# Patient Record
Sex: Female | Born: 1935 | Race: White | Hispanic: No | Marital: Married | Smoking: Former smoker
Health system: Southern US, Community
[De-identification: ages and names within clinical notes are randomized; demographics above are authoritative.]

## PROBLEM LIST (undated history)

## (undated) DIAGNOSIS — Z8489 Family history of other specified conditions: Secondary | ICD-10-CM

## (undated) DIAGNOSIS — I4891 Unspecified atrial fibrillation: Secondary | ICD-10-CM

## (undated) DIAGNOSIS — F0280 Dementia in other diseases classified elsewhere without behavioral disturbance: Secondary | ICD-10-CM

## (undated) DIAGNOSIS — I10 Essential (primary) hypertension: Secondary | ICD-10-CM

## (undated) DIAGNOSIS — F419 Anxiety disorder, unspecified: Secondary | ICD-10-CM

## (undated) DIAGNOSIS — G3183 Dementia with Lewy bodies: Secondary | ICD-10-CM

## (undated) DIAGNOSIS — E785 Hyperlipidemia, unspecified: Secondary | ICD-10-CM

## (undated) DIAGNOSIS — C801 Malignant (primary) neoplasm, unspecified: Secondary | ICD-10-CM

## (undated) DIAGNOSIS — F0390 Unspecified dementia without behavioral disturbance: Secondary | ICD-10-CM

## (undated) DIAGNOSIS — R002 Palpitations: Secondary | ICD-10-CM

## (undated) HISTORY — PX: APPENDECTOMY: SHX54

## (undated) HISTORY — DX: Hyperlipidemia, unspecified: E78.5

## (undated) HISTORY — PX: ABDOMINAL HYSTERECTOMY: SUR658

## (undated) HISTORY — DX: Malignant (primary) neoplasm, unspecified: C80.1

## (undated) HISTORY — DX: Palpitations: R00.2

## (undated) HISTORY — PX: BREAST BIOPSY: SHX20

## (undated) HISTORY — DX: Unspecified atrial fibrillation: I48.91

---

## 1997-09-17 ENCOUNTER — Ambulatory Visit (HOSPITAL_BASED_OUTPATIENT_CLINIC_OR_DEPARTMENT_OTHER): Admission: RE | Admit: 1997-09-17 | Discharge: 1997-09-17 | Payer: Self-pay | Admitting: General Surgery

## 1999-03-15 ENCOUNTER — Encounter: Payer: Self-pay | Admitting: Obstetrics and Gynecology

## 1999-03-15 ENCOUNTER — Encounter: Admission: RE | Admit: 1999-03-15 | Discharge: 1999-03-15 | Payer: Self-pay | Admitting: Obstetrics and Gynecology

## 1999-09-11 ENCOUNTER — Encounter: Payer: Self-pay | Admitting: Internal Medicine

## 1999-09-11 ENCOUNTER — Encounter: Admission: RE | Admit: 1999-09-11 | Discharge: 1999-09-11 | Payer: Self-pay | Admitting: Internal Medicine

## 2000-09-11 ENCOUNTER — Encounter: Payer: Self-pay | Admitting: General Surgery

## 2000-09-11 ENCOUNTER — Encounter: Admission: RE | Admit: 2000-09-11 | Discharge: 2000-09-11 | Payer: Self-pay | Admitting: General Surgery

## 2001-09-15 ENCOUNTER — Encounter: Payer: Self-pay | Admitting: General Surgery

## 2001-09-15 ENCOUNTER — Encounter: Admission: RE | Admit: 2001-09-15 | Discharge: 2001-09-15 | Payer: Self-pay | Admitting: General Surgery

## 2002-01-29 ENCOUNTER — Encounter: Payer: Self-pay | Admitting: Internal Medicine

## 2002-01-29 ENCOUNTER — Encounter: Admission: RE | Admit: 2002-01-29 | Discharge: 2002-01-29 | Payer: Self-pay | Admitting: Internal Medicine

## 2002-09-17 ENCOUNTER — Encounter: Admission: RE | Admit: 2002-09-17 | Discharge: 2002-09-17 | Payer: Self-pay | Admitting: General Surgery

## 2002-09-17 ENCOUNTER — Encounter: Payer: Self-pay | Admitting: General Surgery

## 2002-09-23 ENCOUNTER — Encounter: Admission: RE | Admit: 2002-09-23 | Discharge: 2002-09-23 | Payer: Self-pay | Admitting: General Surgery

## 2002-09-23 ENCOUNTER — Encounter: Payer: Self-pay | Admitting: General Surgery

## 2003-03-19 ENCOUNTER — Encounter: Admission: RE | Admit: 2003-03-19 | Discharge: 2003-03-19 | Payer: Self-pay | Admitting: General Surgery

## 2003-05-03 ENCOUNTER — Encounter (INDEPENDENT_AMBULATORY_CARE_PROVIDER_SITE_OTHER): Payer: Self-pay | Admitting: Specialist

## 2003-05-03 ENCOUNTER — Ambulatory Visit (HOSPITAL_COMMUNITY): Admission: RE | Admit: 2003-05-03 | Discharge: 2003-05-03 | Payer: Self-pay | Admitting: Gastroenterology

## 2003-05-14 ENCOUNTER — Other Ambulatory Visit: Admission: RE | Admit: 2003-05-14 | Discharge: 2003-05-14 | Payer: Self-pay | Admitting: Obstetrics and Gynecology

## 2003-09-23 ENCOUNTER — Encounter: Admission: RE | Admit: 2003-09-23 | Discharge: 2003-09-23 | Payer: Self-pay | Admitting: General Surgery

## 2004-09-28 ENCOUNTER — Encounter: Admission: RE | Admit: 2004-09-28 | Discharge: 2004-09-28 | Payer: Self-pay | Admitting: General Surgery

## 2005-10-04 ENCOUNTER — Encounter: Admission: RE | Admit: 2005-10-04 | Discharge: 2005-10-04 | Payer: Self-pay | Admitting: General Surgery

## 2005-10-12 ENCOUNTER — Encounter: Admission: RE | Admit: 2005-10-12 | Discharge: 2005-10-12 | Payer: Self-pay | Admitting: General Surgery

## 2006-10-10 ENCOUNTER — Encounter: Admission: RE | Admit: 2006-10-10 | Discharge: 2006-10-10 | Payer: Self-pay | Admitting: General Surgery

## 2007-03-07 ENCOUNTER — Emergency Department (HOSPITAL_COMMUNITY): Admission: EM | Admit: 2007-03-07 | Discharge: 2007-03-07 | Payer: Self-pay | Admitting: Emergency Medicine

## 2007-10-30 ENCOUNTER — Encounter: Admission: RE | Admit: 2007-10-30 | Discharge: 2007-10-30 | Payer: Self-pay | Admitting: Internal Medicine

## 2008-08-26 HISTORY — PX: CARDIAC CATHETERIZATION: SHX172

## 2008-09-08 ENCOUNTER — Inpatient Hospital Stay (HOSPITAL_COMMUNITY): Admission: AD | Admit: 2008-09-08 | Discharge: 2008-09-10 | Payer: Self-pay | Admitting: Cardiology

## 2008-11-05 ENCOUNTER — Encounter: Admission: RE | Admit: 2008-11-05 | Discharge: 2008-11-05 | Payer: Self-pay | Admitting: Internal Medicine

## 2009-10-04 ENCOUNTER — Ambulatory Visit: Payer: Self-pay | Admitting: Cardiovascular Disease

## 2009-11-01 ENCOUNTER — Ambulatory Visit: Payer: Self-pay | Admitting: Cardiovascular Disease

## 2009-11-15 ENCOUNTER — Encounter: Admission: RE | Admit: 2009-11-15 | Discharge: 2009-11-15 | Payer: Self-pay | Admitting: Surgery

## 2009-11-29 ENCOUNTER — Ambulatory Visit: Payer: Self-pay | Admitting: Cardiovascular Disease

## 2009-12-15 ENCOUNTER — Ambulatory Visit: Payer: Self-pay | Admitting: Cardiovascular Disease

## 2010-01-13 ENCOUNTER — Ambulatory Visit: Payer: Self-pay | Admitting: Cardiology

## 2010-02-10 ENCOUNTER — Ambulatory Visit: Payer: Self-pay | Admitting: Cardiology

## 2010-03-17 ENCOUNTER — Ambulatory Visit: Payer: Self-pay | Admitting: Cardiovascular Disease

## 2010-03-19 ENCOUNTER — Encounter: Payer: Self-pay | Admitting: Surgery

## 2010-03-28 ENCOUNTER — Ambulatory Visit: Payer: Self-pay | Admitting: Cardiovascular Disease

## 2010-04-11 ENCOUNTER — Encounter (INDEPENDENT_AMBULATORY_CARE_PROVIDER_SITE_OTHER): Payer: Medicare Other

## 2010-04-11 DIAGNOSIS — Z7901 Long term (current) use of anticoagulants: Secondary | ICD-10-CM

## 2010-04-11 DIAGNOSIS — I4891 Unspecified atrial fibrillation: Secondary | ICD-10-CM

## 2010-05-09 ENCOUNTER — Encounter (INDEPENDENT_AMBULATORY_CARE_PROVIDER_SITE_OTHER): Payer: Medicare Other

## 2010-05-09 DIAGNOSIS — I4891 Unspecified atrial fibrillation: Secondary | ICD-10-CM

## 2010-05-09 DIAGNOSIS — Z7901 Long term (current) use of anticoagulants: Secondary | ICD-10-CM

## 2010-06-04 LAB — PROTIME-INR
INR: 1.2 (ref 0.00–1.49)
Prothrombin Time: 15.5 seconds — ABNORMAL HIGH (ref 11.6–15.2)

## 2010-06-05 LAB — HEPARIN LEVEL (UNFRACTIONATED)
Heparin Unfractionated: 0.22 IU/mL — ABNORMAL LOW (ref 0.30–0.70)
Heparin Unfractionated: 0.41 IU/mL (ref 0.30–0.70)

## 2010-06-05 LAB — CBC
HCT: 34.9 % — ABNORMAL LOW (ref 36.0–46.0)
Hemoglobin: 11.9 g/dL — ABNORMAL LOW (ref 12.0–15.0)
RDW: 13 % (ref 11.5–15.5)

## 2010-06-05 LAB — COMPREHENSIVE METABOLIC PANEL
Albumin: 4 g/dL (ref 3.5–5.2)
CO2: 27 mEq/L (ref 19–32)
Calcium: 9.4 mg/dL (ref 8.4–10.5)
GFR calc Af Amer: >60 mL/min (ref >60)
GFR calc non Af Amer: >60 mL/min (ref >60)
Glucose, Bld: 101 mg/dL — ABNORMAL HIGH (ref 70–99)
Sodium: 137 mEq/L (ref 135–145)

## 2010-06-05 LAB — CARDIAC PANEL(CRET KIN+CKTOT+MB+TROPI)
Relative Index: 0.9 (ref 0.0–2.5)
Total CK: 102 U/L (ref 7–177)
Troponin I: 0.02 ng/mL (ref 0.00–0.06)
Troponin I: 0.03 ng/mL (ref 0.00–0.06)

## 2010-06-05 LAB — LIPID PANEL
LDL Cholesterol: 72 mg/dL (ref 0–99)
VLDL: 10 mg/dL (ref 0–40)

## 2010-06-05 LAB — DIGOXIN LEVEL: Digoxin Level: 0.8 ng/mL (ref 0.8–2.0)

## 2010-06-05 LAB — PROTIME-INR
INR: 1.4 (ref 0.00–1.49)
Prothrombin Time: 26.9 seconds — ABNORMAL HIGH (ref 11.6–15.2)

## 2010-06-06 ENCOUNTER — Ambulatory Visit (INDEPENDENT_AMBULATORY_CARE_PROVIDER_SITE_OTHER): Payer: Medicare Other | Admitting: *Deleted

## 2010-06-06 DIAGNOSIS — Z7901 Long term (current) use of anticoagulants: Secondary | ICD-10-CM

## 2010-06-06 DIAGNOSIS — I4891 Unspecified atrial fibrillation: Secondary | ICD-10-CM | POA: Insufficient documentation

## 2010-06-06 LAB — POCT INR: INR: 2.6

## 2010-06-20 ENCOUNTER — Encounter: Payer: Self-pay | Admitting: Cardiovascular Disease

## 2010-06-21 ENCOUNTER — Encounter: Payer: Self-pay | Admitting: Cardiovascular Disease

## 2010-06-21 DIAGNOSIS — R079 Chest pain, unspecified: Secondary | ICD-10-CM | POA: Insufficient documentation

## 2010-06-21 DIAGNOSIS — E785 Hyperlipidemia, unspecified: Secondary | ICD-10-CM | POA: Insufficient documentation

## 2010-06-27 ENCOUNTER — Ambulatory Visit: Payer: Medicare Other | Admitting: Cardiovascular Disease

## 2010-06-27 ENCOUNTER — Ambulatory Visit (INDEPENDENT_AMBULATORY_CARE_PROVIDER_SITE_OTHER): Payer: Medicare Other | Admitting: Cardiovascular Disease

## 2010-06-27 ENCOUNTER — Ambulatory Visit (INDEPENDENT_AMBULATORY_CARE_PROVIDER_SITE_OTHER): Payer: Medicare Other | Admitting: *Deleted

## 2010-06-27 ENCOUNTER — Encounter: Payer: Self-pay | Admitting: Cardiovascular Disease

## 2010-06-27 VITALS — BP 138/82 | HR 61 | Ht 66.0 in | Wt 134.6 lb

## 2010-06-27 DIAGNOSIS — I4891 Unspecified atrial fibrillation: Secondary | ICD-10-CM

## 2010-06-27 DIAGNOSIS — Z7901 Long term (current) use of anticoagulants: Secondary | ICD-10-CM

## 2010-06-27 NOTE — Assessment & Plan Note (Signed)
Starlena remains in normal sinus rhythm. We will continue with the same dose of breath. I've not heard of constipation being a complication of flecainide. I've asked her to check with her medical Dr. To make sure that she doesn't have other issues that are causing her constipation. She  had a colonoscopy a year ago which was normal. She's also had her thyroid checked recently and it was normal.

## 2010-06-27 NOTE — Progress Notes (Signed)
Candice Schultz Date of Birth  March 30, 1934 [facility] / [facility] [address]     [address] [place], [place]  [address] [phone]  Fax  [phone]  History of Present Illness:  Has had some dizziness and hypotension.  Has felt faint this week.  BP was 70.  Eating and drinking normally.  C/O constipation - Which she thinks is due to Flecainide. Rhythm has been stable.  She denies any chest pain or shortness of breath  Current Outpatient Prescriptions on File Prior to Visit  Medication Sig Dispense Refill  . aspirin 81 MG tablet Take 81 mg by mouth daily.        . calcium carbonate (OS-CAL) 600 MG TABS Take 600 mg by mouth 2 (two) times daily with a meal.        . digoxin (LANOXIN) 0.125 MG tablet Take 125 mcg by mouth daily.        . flecainide (TAMBOCOR) 100 MG tablet Take 100 mg by mouth 2 (two) times daily.        . MECLIZINE HCL PO Take by mouth as needed.        . Multiple Vitamin (MULTIVITAMIN) tablet Take 1 tablet by mouth daily.        . propranolol (INDERAL) 10 MG tablet Take 10 mg by mouth as needed.        . sodium chloride (OCEAN) 0.65 % nasal spray 1 spray by Nasal route as needed.        . warfarin (COUMADIN) 5 MG tablet Take by mouth as directed.        Marland Kitchen estradiol (ESTRING) 2 MG vaginal ring Place 2 mg vaginally every 3 (three) months. follow package directions         Allergies  Allergen Reactions  . Penicillins   . Rythmol Sr     Past Medical History  Diagnosis Date  . AF (atrial fibrillation)   . Chest pain   . Hyperlipidemia     Past Surgical History  Procedure Date  . Cardiac catheterization 08/2008    NORMAL CORONARY ARTERIES    History  Smoking status  . Former Smoker  . Quit date: 02/27/1983  Smokeless tobacco  . Not on file    History  Alcohol Use No    No family history on file.  Reviw of Systems:  Reviewed in the HPI.  All other systems are negative.  Physical Exam: BP 138/82  Pulse 61  Ht 5\' 6"   (1.676 m)  Wt 134 lb 9.6 oz (61.054 kg)  BMI 21.72 kg/m2 The patient is alert and oriented x 3.  The mood and affect are normal.  The skin is warm and dry.  Color is normal.  The HEENT exam reveals that the sclera are nonicteric.  The mucous membranes are moist.  The carotids are 2+ without bruits.  There is no thyromegaly.  There is no JVD.  The lungs are clear.  The chest wall is non tender.  The heart exam reveals a regular rate with a normal S1 and S2.  She has a 2/6 systolic murmur.  The PMI is not displaced.   Abdominal exam reveals good bowel sounds.  There is no guarding or rebound.  There is no hepatosplenomegaly or tenderness.  There are no masses.  Exam of the legs reveal no clubbing, cyanosis, or edema.  The legs are without rashes.  The distal pulses are intact.  Cranial nerves II - XII are  intact.  Motor and sensory functions are intact.  The gait is normal.  ECG: Normal sinus rhythm. She has a first degree AV block. She has T wave inversions in leads V3 through V5.  Assessment / Plan:

## 2010-07-11 NOTE — Cardiovascular Report (Signed)
[name], Candice Schultz                  ACCOUNT NO.:  [id number]   MEDICAL RECORD NO.:  [id number]          PATIENT TYPE:  INP   LOCATION:  3707                         FACILITY:  [facility]   PHYSICIAN:  Colleen Can. Deborah Chalk, M.D.DATE OF BIRTH:  [birth year]   DATE OF PROCEDURE:  09/09/2008  DATE OF DISCHARGE:                            CARDIAC CATHETERIZATION   PROCEDURE:  Left heart catheterization with selective coronary  angiography and left ventricular angiography.   TYPE AND SITE OF ENTRY:  Percutaneous right femoral artery.   CATHETERS:  6-French 4 curved Judkins right and left coronary catheters  and 6-French pigtail ventriculographic catheter.   CONTRAST MATERIAL:  Omnipaque.   MEDICATIONS GIVEN PRIOR TO PROCEDURE:  Valium 10 mg.   MEDICATIONS GIVEN DURING THE PROCEDURE:  Versed 2 mg IV.   COMMENTS:  The patient tolerated the procedure well.   HEMODYNAMIC DATA:  The aortic pressure was 128/4-16.  The LV was 142/2-  15.  There was no aortic valve gradient noted on pullback.   ANGIOGRAPHIC DATA:  1. Left main coronary artery is normal.  2. Left anterior descending was only of moderate size.  It is      tortuous.  It was normal.  3. Left circumflex continued as a moderate-sized obtuse marginal      vessel with no frank continuation in the AV groove that was noted.      Anomalous possibilities were entertained, but could not be seen in      the right coronary cusp or off of the left cusp.  It is felt that      the circumflex marginal was the left circumflex system in toto.   Right coronary artery:  The right coronary artery is a very large  dominant vessel with a continuation branch and a large posterior  descending that extended to the apex.  It was normal.   Left ventricular angiogram was performed in the RAO position.  Overall  cardiac size and silhouette were normal.  The global ejection fraction  was estimated to be at 60%.  Regional wall motion was normal.   Right femoral  artery angiogram was performed with anticipation of the  Angio-Seal closure.  However, puncture was made into recurrent branch to  the inferior epigastric wall.  It is felt that it was not suitable for  Angio-Seal.   OVERALL IMPRESSION:  1. Normal left ventricular function.  2. Normal coronary arteries.      Colleen Can. Deborah Chalk, M.D.  Electronically Signed     SNT/MEDQ  D:  09/09/2008  T:  09/10/2008  Job:  [id number]

## 2010-07-11 NOTE — H&P (Signed)
Candice Schultz, Candice Schultz                  ACCOUNT NO.:  [id number]   MEDICAL RECORD NO.:  [id number]          PATIENT TYPE:  INP   LOCATION:  3707                         FACILITY:  [facility]   PHYSICIAN:  Cassell Clement, M.D. DATE OF BIRTH:  [birth year]   DATE OF ADMISSION:  09/08/2008  DATE OF DISCHARGE:                              HISTORY & PHYSICAL   CHIEF COMPLAINT:  Chest pain.   HISTORY:  This is a 76 year old married Caucasian female who is seen as  a work-in in the office on the morning of September 08, 2008.  She presents  with a history of substernal chest pressure radiating up to both  shoulders, but not down to the arms.  The pain began when she was at  work earlier this morning and has persisted about an hour and a half.  There was no nausea or vomiting, but she was somewhat short of breath.  She did not have any diaphoresis.  She does not have any prior history  of ischemic heart disease.  She had a treadmill Cardiolite stress test  on June 19, 2004, which was negative.  She walked 8 minutes which was 2  minutes into stage III and the test was stopped because of achieving  target heart rate.  The patient does have a history of atrial  fibrillation, which has been paroxysmal.  She also has a history of  mitral valve prolapse.  She was last seen in our office on August 26, 2008,  by Dr. Elease Hashimoto.  She had had a prior trial of Rythmol but did not  tolerate it.  She has been looking into the possibility of a  radiofrequency ablation.  She has a daughter who lives in [place],  [place], and is pursuing possible ablation centers in [place].  The patient has been on Coumadin since Jun 29, 2008.  Her INR on August 26, 2008, was subtherapeutic at 1.6 and her Coumadin dose at that time was  increased to 5 mg 4 days a week and 2.5 mg 3 days a week.  She returned  today as an urgent work-in because of the chest pain and we did an INR,  which she is now therapeutic at 2.4.   FAMILY HISTORY:   Negative for premature coronary artery disease.  Family  history is positive for atrial fibrillation in one sister.  Both of her  parents lived into their mid 11s and died of noncoronary cause.   SOCIAL HISTORY:  The patient smoked briefly after nursing school, but  has not smoked in many years.  She does not drink alcohol.  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   PRESENT MEDICATIONS:  1. Calcium 600 mg daily.  2. Aspirin 325 mg daily.  3. Multivitamin daily.  4. Simvastatin 20 mg daily.  5. Meclizine p.r.n.  6. Coumadin 5 mg 4 days a weak, 2.5 mg 3 days a week.  7. Toprol-XL 50 mg daily.  8. Digoxin 0.25 mg daily.  9. Estring every 3 months.   ALLERGIES:  She is allergic to PENICILLIN and she did not tolerate  RYTHMOL.   All other review of systems negative in detail.   PHYSICAL EXAMINATION:  GENERAL:  In the office, her chest pain had  stopped and then started again precipitating our decision to arrange for  hospitalization today.  Her general appearance reveals a well-developed  and well-nourished woman in no distress.  SKIN:  She has skin which is warm and dry.  She is not diaphoretic.  Color is good.  Skin reveals no rash.  HEENT:  Pupils equal and reactive.  Sclerae nonicteric.  Mouth and  pharynx normal.  NECK:  Jugular venous pressure normal.  Thyroid normal.  Carotids  normal.  No lymphadenopathy.  CHEST:  Clear to percussion and auscultation.  HEART:  A faint apical systolic murmur.  There is no gallop or rub.  There is no chest wall tenderness.  ABDOMEN:  Soft without hepatosplenomegaly or masses.  The liver and  spleen are not enlarged.  EXTREMITIES:  Good peripheral pulses.  No phlebitis or edema.  NEUROLOGIC:  Physiologic.   Her electrocardiogram in the office showed new lateral ST-segment   depression in lead II, III, and V4 through V6.  She is in atrial  fibrillation with a controlled ventricular response.  The ST-segment  depression may be due to digitalis effect, but we certainly can exclude  new lateral wall ischemia since her previous EKG.  Her INR is 2.4.   IMPRESSION:  1. Chest pain, possible unstable angina pectoris.  2. Atrial fibrillation.  3. Coumadin anticoagulation.  4. History of hyperlipidemia.  5. Past history of cigarette smoking remotely.   DISPOSITION:  She is being admitted to 3700 telemetry.  Serial enzymes  will be obtained.  Serial EKGs.  We will reverse her Coumadin with IV  vitamin K today and put her on IV heparin.  We will continue beta  blocker and aspirin.  We will get a digoxin level and check lipids.  We  will anticipate cardiac catheterization tomorrow on September 09, 2008, after  correction of her coagulation abnormalities by Dr. Deborah Chalk.           ______________________________  Cassell Clement, M.D.     TB/MEDQ  D:  09/08/2008  T:  09/09/2008  Job:  [id number]   cc:   Thora Lance, M.D.  Vesta Mixer, M.D.  Colleen Can. Deborah Chalk, M.D.

## 2010-07-11 NOTE — Discharge Summary (Signed)
NAMEEAVAN, GONTERMAN                  ACCOUNT NO.:  [id number]   MEDICAL RECORD NO.:  [id number]          PATIENT TYPE:  INP   LOCATION:  3707                         FACILITY:  [facility]   PHYSICIAN:  Colleen Can. Deborah Chalk, M.D.DATE OF BIRTH:  14-Jan-1935   DATE OF ADMISSION:  09/08/2008  DATE OF DISCHARGE:  09/10/2008                               DISCHARGE SUMMARY   DISCHARGE DIAGNOSES:  1. Chest pain with negative cardiac enzymes, and subsequent elective      cardiac catheterization showing normal coronary arteries with      normal left ventricular function.  2. Recurrent atrial fibrillation.  The patient is pursuing possible      ablative therapy.  3. Coumadin anticoagulation.  4. Hyperlipidemia.  5. Remote tobacco abuse.   HISTORY OF PRESENT ILLNESS:  Candice Schultz is a very pleasant 76 year old white  female who was seen as a work-in by Dr. Patty Sermons in the morning of September 08, 2008.  She was complaining of substernal chest pressure that  radiated up to both shoulders, but not down the arms.  Her discomfort  started while she was at work and persisted for at least an hour and a  half.  She was short of breath.  She did not have any nausea, vomiting,  or diaphoresis.  She was subsequently seen in the office and was  referred for admission with plans for cardiac catheterization.   Please see the history and physical for further patient presentation and  profile.   LABORATORY DATA:  On admission, her cardiac enzymes were negative.  Her  CBC showed hemoglobin 11.9, hematocrit 34.9, white count 4.3, and  platelets 222.  Chemistry showed sodium 137, potassium 4.0, chloride  103, CO2 of 27, BUN 9, creatinine 0.7, and a glucose of 101.  Her INR  initially was 2.4.  She was given vitamin K and subsequently dropped to  1.4.  All cardiac enzymes were negative.  Her total cholesterol was 140,  LDL 72, HDL 58, triglycerides 52.  Digoxin level is 0.8 and high  sensitivity CRP is 0.3.   Chest x-ray  showed minimal enlargement the cardiac silhouette.  There  were no acute findings.  Her EKG showed new lateral ST depression in  leads V4 through V6 in the setting of atrial fibrillation.   HOSPITAL COURSE:  The patient was admitted electively.  Serial enzymes  were drawn.  Her Coumadin was reversed with vitamin K.  Her home  medicines were continued.  We proceeded on with cardiac catheterization  the following afternoon.  That procedure was tolerated well without any  known complications.  LV function was normal.  The left main was normal.  The LAD is normal.  Left circumflex is small, but normal.  Right  coronary is a large dominant vessel, but is normal.  The right femoral  artery puncture site was not suitable for Angio-Seal, and the patient  subsequently was sent back to telemetry and had sufficient bedrest time.  She did have transient hypotension that was treated with IV fluids with  resolution.   Today on September 10, 2008, she is doing well without complaints.  Blood  pressure is satisfactory. She remains in atrial fibrillation. She is  felt to be a satisfactory candidate for discharge.  She is continuing to  pursue a possibility of ablation.  She has not tolerated Rythmol in the  past.   DISCHARGE CONDITION:  Stable.   DISCHARGE DIET:  Low salt, heart healthy.   ACTIVITY:  To be increased as tolerated.   WOUND CARE:  She can use an ice pack if needed to the right groin.   We will have her follow up with Dr. Elease Hashimoto in 1 week.  She will need to  have a followup protime at that visit as well.   DISCHARGE MEDICINES:  1. Coumadin 5 mg x4 days, 2.5 mg x3 days.  2. Digoxin 0.25 daily.  3. Simvastatin 20 mg a day daily.  4. Toprol-XL 50 mg daily.  5. Baby aspirin daily.  6. Multivitamin daily.  7. Caltrate daily.   We will plan on seeing her back in the office in 1 week, certainly  sooner if any problems would arise in the interim.   Greater than 30 minutes was spent  preparing this discharge.      Sharlee Blew, N.P.      Colleen Can. Deborah Chalk, M.D.  Electronically Signed    LC/MEDQ  D:  09/10/2008  T:  09/10/2008  Job:  [id number]   cc:   Vesta Mixer, M.D.

## 2010-07-14 NOTE — Op Note (Signed)
NAMELORRY, FURBER                            ACCOUNT NO.:  [id number]   MEDICAL RECORD NO.:  [id number]                   PATIENT TYPE:  AMB   LOCATION:  ENDO                                 FACILITY:  [facility]   PHYSICIAN:  Danise Edge, M.D.                DATE OF BIRTH:  04-Apr-1934   DATE OF PROCEDURE:  05/03/2003  DATE OF DISCHARGE:                                 OPERATIVE REPORT   PROCEDURE:  Screening colonoscopy with polypectomy.   INDICATIONS:  Candice Schultz is a 76 year old female, born November 01, 1934.  Candice Schultz is scheduled to undergo her first screening colonoscopy  with polypectomy to prevent colon cancer.   ENDOSCOPIST:  Danise Edge, M.D.   PREMEDICATION:  Versed 6 mg, fentanyl  62.5 mcg.   DESCRIPTION OF PROCEDURE:  After obtaining informed consent, Candice Schultz was  placed in the left lateral decubitus position.  I administered intravenous  fentanyl 62.5 mcg and intravenous Versed 6 mg  to achieve conscious sedation  for the procedure.  The patient's blood pressure, oxygen saturation and  cardiac rhythm were monitored throughout the procedure and documented in the  medical record.   Anal inspection was normal.  Digital rectal exam was normal.  The Olympus  adjustable pediatric colonoscope was introduced into the rectum and advanced  to the cecum.  Colonic preparation for the exam today was excellent.  Rectum:  Normal.  Sigmoid colon and descending colon:  From the distal sigmoid colon and 30 cm  from the anal verge, a 2 mm sessile polyp was removed with electrocautery  snare and submitted for pathology interpretation.  Splenic flexure:  Normal.  Transverse colon:  Normal.  Hepatic flexure:  Normal.  Ascending colon:  Normal.  Cecum and ileocecal valve:  Normal.   ASSESSMENT:  From the distal sigmoid colon, a 2 mm sessile polyp was removed  with electrocautery snare; otherwise normal screening proctocolonoscopy to  the cecum.   RECOMMENDATIONS:   Repeat colonoscopy in five years if polyp returns  neoplastic pathologically.                                               Danise Edge, M.D.    MJ/MEDQ  D:  05/03/2003  T:  05/03/2003  Job:  [id number]   cc:   Thora Lance, M.D.  [address]  [place]  [place] [address]  Fax: [phone]

## 2010-07-28 ENCOUNTER — Encounter: Payer: Medicare Other | Admitting: *Deleted

## 2010-07-31 ENCOUNTER — Encounter (INDEPENDENT_AMBULATORY_CARE_PROVIDER_SITE_OTHER): Payer: Medicare Other | Admitting: *Deleted

## 2010-07-31 DIAGNOSIS — Z7901 Long term (current) use of anticoagulants: Secondary | ICD-10-CM

## 2010-07-31 DIAGNOSIS — I4891 Unspecified atrial fibrillation: Secondary | ICD-10-CM

## 2010-08-25 ENCOUNTER — Ambulatory Visit (INDEPENDENT_AMBULATORY_CARE_PROVIDER_SITE_OTHER): Payer: Medicare Other | Admitting: *Deleted

## 2010-08-25 DIAGNOSIS — I4891 Unspecified atrial fibrillation: Secondary | ICD-10-CM

## 2010-08-25 DIAGNOSIS — Z7901 Long term (current) use of anticoagulants: Secondary | ICD-10-CM

## 2010-08-28 ENCOUNTER — Encounter: Payer: Medicare Other | Admitting: *Deleted

## 2010-09-22 ENCOUNTER — Ambulatory Visit (INDEPENDENT_AMBULATORY_CARE_PROVIDER_SITE_OTHER): Payer: Medicare Other | Admitting: *Deleted

## 2010-09-22 DIAGNOSIS — Z7901 Long term (current) use of anticoagulants: Secondary | ICD-10-CM

## 2010-09-22 DIAGNOSIS — I4891 Unspecified atrial fibrillation: Secondary | ICD-10-CM

## 2010-09-22 LAB — POCT INR: INR: 2.5

## 2010-10-10 ENCOUNTER — Other Ambulatory Visit (INDEPENDENT_AMBULATORY_CARE_PROVIDER_SITE_OTHER): Payer: Self-pay | Admitting: Surgery

## 2010-10-10 DIAGNOSIS — Z1231 Encounter for screening mammogram for malignant neoplasm of breast: Secondary | ICD-10-CM

## 2010-10-20 ENCOUNTER — Ambulatory Visit (INDEPENDENT_AMBULATORY_CARE_PROVIDER_SITE_OTHER): Payer: Medicare Other | Admitting: *Deleted

## 2010-10-20 DIAGNOSIS — I4891 Unspecified atrial fibrillation: Secondary | ICD-10-CM

## 2010-10-20 DIAGNOSIS — Z7901 Long term (current) use of anticoagulants: Secondary | ICD-10-CM

## 2010-10-20 LAB — POCT INR: INR: 2.7

## 2010-10-30 ENCOUNTER — Other Ambulatory Visit: Payer: Self-pay | Admitting: Cardiovascular Disease

## 2010-10-31 ENCOUNTER — Other Ambulatory Visit: Payer: Self-pay | Admitting: Pharmacist

## 2010-10-31 ENCOUNTER — Other Ambulatory Visit: Payer: Self-pay | Admitting: *Deleted

## 2010-10-31 MED ORDER — PROPRANOLOL HCL 10 MG PO TABS: ORAL_TABLET | ORAL | Status: DC

## 2010-10-31 MED ORDER — WARFARIN SODIUM 5 MG PO TABS: ORAL_TABLET | ORAL | Status: DC

## 2010-10-31 MED ORDER — DIGOXIN 250 MCG PO TABS: 250.0000 ug | ORAL_TABLET | Freq: Every day | ORAL | Status: DC

## 2010-10-31 NOTE — Telephone Encounter (Signed)
Dr Ian Bushman confirmed refill.

## 2010-10-31 NOTE — Telephone Encounter (Signed)
Called pharm and verified dose, rx same for one year, entered incorrectly in our e-record.

## 2010-11-01 ENCOUNTER — Other Ambulatory Visit: Payer: Self-pay | Admitting: *Deleted

## 2010-11-01 MED ORDER — DIGOXIN 250 MCG PO TABS: 250.0000 ug | ORAL_TABLET | Freq: Every day | ORAL | Status: DC

## 2010-11-17 ENCOUNTER — Ambulatory Visit (INDEPENDENT_AMBULATORY_CARE_PROVIDER_SITE_OTHER): Payer: Medicare Other | Admitting: *Deleted

## 2010-11-17 DIAGNOSIS — Z7901 Long term (current) use of anticoagulants: Secondary | ICD-10-CM

## 2010-11-17 DIAGNOSIS — I4891 Unspecified atrial fibrillation: Secondary | ICD-10-CM

## 2010-11-29 ENCOUNTER — Ambulatory Visit
Admission: RE | Admit: 2010-11-29 | Discharge: 2010-11-29 | Disposition: A | Discharge status: Home / Self Care | Payer: Medicare Other | Source: Ambulatory Visit | Attending: Surgery | Admitting: Surgery

## 2010-11-29 DIAGNOSIS — Z1231 Encounter for screening mammogram for malignant neoplasm of breast: Secondary | ICD-10-CM

## 2010-12-11 ENCOUNTER — Encounter (INDEPENDENT_AMBULATORY_CARE_PROVIDER_SITE_OTHER): Payer: Self-pay | Admitting: General Surgery

## 2010-12-11 DIAGNOSIS — Z853 Personal history of malignant neoplasm of breast: Secondary | ICD-10-CM | POA: Insufficient documentation

## 2010-12-12 ENCOUNTER — Encounter (INDEPENDENT_AMBULATORY_CARE_PROVIDER_SITE_OTHER): Payer: Self-pay | Admitting: Surgery

## 2010-12-12 ENCOUNTER — Ambulatory Visit (INDEPENDENT_AMBULATORY_CARE_PROVIDER_SITE_OTHER): Payer: Medicare Other | Admitting: Surgery

## 2010-12-12 VITALS — BP 126/88 | HR 88 | Temp 97.4°F | Resp 20 | Ht 65.75 in | Wt 132.4 lb

## 2010-12-12 DIAGNOSIS — Z853 Personal history of malignant neoplasm of breast: Secondary | ICD-10-CM

## 2010-12-12 NOTE — Progress Notes (Signed)
NAME: Candice Schultz       DOB: 1935/01/23           DATE: 12/12/2010       MRN: [id number]   Candice Schultz is a 76 y.o.Marland Kitchenfemale who presents for routine followup of her LCIS Right breast diagnosed in 1999 and treated with lumpectomy. She has no problems or concerns on either side.  PFSH: She has had no significant changes since the last visit here.  ROS: There have been no significant changes since the last visit here  EXAM: General: The patient is alert, oriented, generally healty appearing, NAD. Mood and affect are normal.  Breasts:  Symmetric in appearance. No mass, tenderness or problems on either side  Lymphatics: She has no axillary or supraclavicular adenopathy on either side.  Extremities: Full ROM of the surgical side with no lymphedema noted.  Data Reviewed: Mammogram earlier this month was wnl  Impression: Doing well, with no evidence of recurrent cancer or new cancer  Plan: Will continue to follow up on an annual basis here.

## 2010-12-12 NOTE — Patient Instructions (Signed)
I will see you again next year. Let me know if you have any problems

## 2010-12-15 ENCOUNTER — Ambulatory Visit (INDEPENDENT_AMBULATORY_CARE_PROVIDER_SITE_OTHER): Payer: Medicare Other | Admitting: *Deleted

## 2010-12-15 DIAGNOSIS — Z7901 Long term (current) use of anticoagulants: Secondary | ICD-10-CM

## 2010-12-15 DIAGNOSIS — I4891 Unspecified atrial fibrillation: Secondary | ICD-10-CM

## 2011-01-03 ENCOUNTER — Other Ambulatory Visit: Payer: Self-pay | Admitting: *Deleted

## 2011-01-03 MED ORDER — PROPRANOLOL HCL 10 MG PO TABS: ORAL_TABLET | ORAL | Status: DC

## 2011-01-12 ENCOUNTER — Ambulatory Visit (INDEPENDENT_AMBULATORY_CARE_PROVIDER_SITE_OTHER): Payer: Medicare Other | Admitting: *Deleted

## 2011-01-12 DIAGNOSIS — Z7901 Long term (current) use of anticoagulants: Secondary | ICD-10-CM

## 2011-01-12 DIAGNOSIS — I4891 Unspecified atrial fibrillation: Secondary | ICD-10-CM

## 2011-01-24 ENCOUNTER — Other Ambulatory Visit: Payer: Self-pay | Admitting: Pharmacist

## 2011-01-24 MED ORDER — WARFARIN SODIUM 5 MG PO TABS: ORAL_TABLET | ORAL | Status: DC

## 2011-01-26 ENCOUNTER — Other Ambulatory Visit: Payer: Self-pay

## 2011-01-26 MED ORDER — PROPRANOLOL HCL 10 MG PO TABS: ORAL_TABLET | ORAL | Status: DC

## 2011-01-26 MED ORDER — DIGOXIN 250 MCG PO TABS: 125.0000 ug | ORAL_TABLET | Freq: Every day | ORAL | Status: DC

## 2011-01-26 MED ORDER — FLECAINIDE ACETATE 100 MG PO TABS: 100.0000 mg | ORAL_TABLET | Freq: Two times a day (BID) | ORAL | Status: DC

## 2011-02-05 ENCOUNTER — Other Ambulatory Visit: Payer: Self-pay | Admitting: Cardiovascular Disease

## 2011-02-05 MED ORDER — WARFARIN SODIUM 5 MG PO TABS: ORAL_TABLET | ORAL | Status: DC

## 2011-02-09 ENCOUNTER — Ambulatory Visit (INDEPENDENT_AMBULATORY_CARE_PROVIDER_SITE_OTHER): Payer: Medicare Other | Admitting: *Deleted

## 2011-02-09 DIAGNOSIS — I4891 Unspecified atrial fibrillation: Secondary | ICD-10-CM

## 2011-02-09 DIAGNOSIS — Z7901 Long term (current) use of anticoagulants: Secondary | ICD-10-CM

## 2011-02-09 LAB — POCT INR: INR: 2.3

## 2011-03-09 ENCOUNTER — Ambulatory Visit (INDEPENDENT_AMBULATORY_CARE_PROVIDER_SITE_OTHER): Payer: Medicare Other | Admitting: *Deleted

## 2011-03-09 DIAGNOSIS — Z7901 Long term (current) use of anticoagulants: Secondary | ICD-10-CM

## 2011-03-09 DIAGNOSIS — I4891 Unspecified atrial fibrillation: Secondary | ICD-10-CM

## 2011-04-20 ENCOUNTER — Ambulatory Visit (INDEPENDENT_AMBULATORY_CARE_PROVIDER_SITE_OTHER): Payer: Medicare Other

## 2011-04-20 ENCOUNTER — Encounter: Payer: Medicare Other | Admitting: *Deleted

## 2011-04-20 DIAGNOSIS — Z7901 Long term (current) use of anticoagulants: Secondary | ICD-10-CM

## 2011-04-20 DIAGNOSIS — I4891 Unspecified atrial fibrillation: Secondary | ICD-10-CM

## 2011-04-20 LAB — POCT INR: INR: 2.4

## 2011-04-20 MED ORDER — WARFARIN SODIUM 5 MG PO TABS: ORAL_TABLET | ORAL | Status: DC

## 2011-06-01 ENCOUNTER — Ambulatory Visit (INDEPENDENT_AMBULATORY_CARE_PROVIDER_SITE_OTHER): Payer: Medicare Other

## 2011-06-01 DIAGNOSIS — I4891 Unspecified atrial fibrillation: Secondary | ICD-10-CM

## 2011-06-01 DIAGNOSIS — Z7901 Long term (current) use of anticoagulants: Secondary | ICD-10-CM

## 2011-06-27 ENCOUNTER — Encounter: Payer: Self-pay | Admitting: Cardiovascular Disease

## 2011-06-27 ENCOUNTER — Ambulatory Visit (INDEPENDENT_AMBULATORY_CARE_PROVIDER_SITE_OTHER): Payer: Medicare Other | Admitting: Cardiovascular Disease

## 2011-06-27 ENCOUNTER — Ambulatory Visit (INDEPENDENT_AMBULATORY_CARE_PROVIDER_SITE_OTHER): Payer: Medicare Other | Admitting: *Deleted

## 2011-06-27 VITALS — BP 130/80 | HR 75 | Ht 66.0 in | Wt 132.0 lb

## 2011-06-27 DIAGNOSIS — I4891 Unspecified atrial fibrillation: Secondary | ICD-10-CM

## 2011-06-27 DIAGNOSIS — Z7901 Long term (current) use of anticoagulants: Secondary | ICD-10-CM

## 2011-06-27 DIAGNOSIS — E785 Hyperlipidemia, unspecified: Secondary | ICD-10-CM

## 2011-06-27 NOTE — Assessment & Plan Note (Signed)
Candice Schultz remains in normal sinus rhythm. We will continue with her current dose of. We'll see her again in one year for an office visit, EKG, and fasting labs.

## 2011-06-27 NOTE — Assessment & Plan Note (Signed)
We'll check fasting labs will see her again in one year. She'll have them checked at her medical doctors office.

## 2011-06-27 NOTE — Patient Instructions (Signed)
Your physician wants you to follow-up in: 1 YEAR You will receive a reminder letter in the mail two months in advance. If you don't receive a letter, please call our office to schedule the follow-up appointment.  Your physician recommends that you return for a FASTING lipid profile: 1 YEAR OR BRING [place] A COPY FOR YOUR CHART HERE, THANKS

## 2011-06-27 NOTE — Progress Notes (Signed)
Candice Schultz Date of Birth  06/13/1934 [facility] [address]     [address] [place], [place]  [address] [phone]  Fax  [phone]  Problem List: 1. Atrial Fibrillation 2. Chest pain - normal coronary arteries 2010 3.  hyperlipidemia 4. Hx of Breast cancer - 1999  History of Present Illness:  Candice Schultz has done well.  Her rhythm has remained stable. She continues to have constipation due to the Flecainide.  She takes propranolol occasionally .  She is exercising on occasion.  Current Outpatient Prescriptions on File Prior to Visit  Medication Sig Dispense Refill  . aspirin 81 MG tablet Take 81 mg by mouth daily.        . calcium carbonate (OS-CAL) 600 MG TABS Take 600 mg by mouth 2 (two) times daily with a meal.        . digoxin (LANOXIN) 0.25 MG tablet Take 0.5 tablets (125 mcg total) by mouth daily.  90 tablet  1  . docusate sodium (COLACE) 100 MG capsule Take 100 mg by mouth 2 (two) times daily.        . flecainide (TAMBOCOR) 100 MG tablet Take 1 tablet (100 mg total) by mouth 2 (two) times daily.  180 tablet  2  . MECLIZINE HCL PO Take by mouth as needed.        . Multiple Vitamin (MULTIVITAMIN) tablet Take 1 tablet by mouth daily.        . propranolol (INDERAL) 10 MG tablet Take one tablet up to 4 times a day as needed  360 tablet  0  . sodium chloride (OCEAN) 0.65 % nasal spray 1 spray by Nasal route as needed.        . warfarin (COUMADIN) 5 MG tablet Take as directed by Anticoagulation clinic.  (Pt takes up to 1 1/2 tablets daily)  135 tablet  1    Allergies  Allergen Reactions  . Penicillins   . Propafenone Hcl Er     Past Medical History  Diagnosis Date  . AF (atrial fibrillation)   . Chest pain   . Hyperlipidemia   . Cancer     breast  . Heart palpitations     related for flecainide and due to having afib   . Constipation     Past Surgical History  Procedure Date  . Cardiac catheterization 08/2008    NORMAL  CORONARY ARTERIES    History  Smoking status  . Former Smoker  . Quit date: 02/27/1983  Smokeless tobacco  . Never Used    History  Alcohol Use No    Family History  Problem Relation Age of Onset  . Cancer Mother     Reviw of Systems:  Reviewed in the HPI.  All other systems are negative.  Physical Exam: BP 174/83  Pulse 75  Ht 5\' 6"  (1.676 m)  Wt 132 lb (59.875 kg)  BMI 21.31 kg/m2 The patient is alert and oriented x 3.  The mood and affect are normal.  The skin is warm and dry.  Color is normal.  The HEENT exam reveals that the sclera are nonicteric.  The mucous membranes are moist.  The carotids are 2+ without bruits.  There is no thyromegaly.  There is no JVD.  The lungs are clear.  The chest wall is non tender.  The heart exam reveals a regular rate with a normal S1 and S2.  She has a 1-2/6 systolic murmur.  The  PMI is not displaced.   Abdominal exam reveals good bowel sounds.  There is no guarding or rebound.  There is no hepatosplenomegaly or tenderness.  There are no masses.  Exam of the legs reveal no clubbing, cyanosis, or edema.  The legs are without rashes.  The distal pulses are intact.  Cranial nerves II - XII are intact.  Motor and sensory functions are intact.  The gait is normal.  ECG: Jun 27, 2011-Normal sinus rhythm. She has a first degree AV block. She has T wave inversions in leads V3 through V5. The EKG is unchanged from previous tracing.  Assessment / Plan:

## 2011-08-08 ENCOUNTER — Ambulatory Visit (INDEPENDENT_AMBULATORY_CARE_PROVIDER_SITE_OTHER): Payer: Medicare Other | Admitting: Pharmacist

## 2011-08-08 DIAGNOSIS — I4891 Unspecified atrial fibrillation: Secondary | ICD-10-CM

## 2011-08-08 DIAGNOSIS — Z7901 Long term (current) use of anticoagulants: Secondary | ICD-10-CM

## 2011-08-08 LAB — POCT INR: INR: 2.2

## 2011-09-19 ENCOUNTER — Ambulatory Visit (INDEPENDENT_AMBULATORY_CARE_PROVIDER_SITE_OTHER): Payer: Medicare Other | Admitting: *Deleted

## 2011-09-19 DIAGNOSIS — Z7901 Long term (current) use of anticoagulants: Secondary | ICD-10-CM

## 2011-09-19 DIAGNOSIS — I4891 Unspecified atrial fibrillation: Secondary | ICD-10-CM

## 2011-09-19 LAB — POCT INR: INR: 2.7

## 2011-09-27 ENCOUNTER — Telehealth: Payer: Self-pay | Admitting: Cardiovascular Disease

## 2011-09-27 NOTE — Telephone Encounter (Signed)
Spoke with pt, pt took her 5mg  pm dosage of Coumadin this am by accident.  Advised pt to not take another dosage of Coumadin tonight, but then to resume normal dosage of Coumadin at scheduled pm time on Friday.  Pt verbalizes understanding.

## 2011-09-27 NOTE — Telephone Encounter (Signed)
Pt took pm meds this am , is there anything she needs to do, and does she need to take them again tonight?? pls call

## 2011-10-17 ENCOUNTER — Ambulatory Visit (INDEPENDENT_AMBULATORY_CARE_PROVIDER_SITE_OTHER): Payer: Medicare Other | Admitting: Pharmacist

## 2011-10-17 DIAGNOSIS — Z7901 Long term (current) use of anticoagulants: Secondary | ICD-10-CM

## 2011-10-17 DIAGNOSIS — I4891 Unspecified atrial fibrillation: Secondary | ICD-10-CM

## 2011-10-17 LAB — POCT INR: INR: 2.1

## 2011-10-19 ENCOUNTER — Other Ambulatory Visit: Payer: Self-pay | Admitting: Cardiovascular Disease

## 2011-10-19 NOTE — Telephone Encounter (Signed)
Fax Received. Refill Completed. Rosiland Sen Chowoe (R.M.A)   

## 2011-11-08 ENCOUNTER — Other Ambulatory Visit (INDEPENDENT_AMBULATORY_CARE_PROVIDER_SITE_OTHER): Payer: Self-pay | Admitting: Surgery

## 2011-11-08 DIAGNOSIS — Z853 Personal history of malignant neoplasm of breast: Secondary | ICD-10-CM

## 2011-11-08 DIAGNOSIS — Z1231 Encounter for screening mammogram for malignant neoplasm of breast: Secondary | ICD-10-CM

## 2011-11-28 ENCOUNTER — Ambulatory Visit (INDEPENDENT_AMBULATORY_CARE_PROVIDER_SITE_OTHER): Payer: Medicare Other | Admitting: *Deleted

## 2011-11-28 DIAGNOSIS — I4891 Unspecified atrial fibrillation: Secondary | ICD-10-CM

## 2011-11-28 DIAGNOSIS — Z7901 Long term (current) use of anticoagulants: Secondary | ICD-10-CM

## 2011-11-28 LAB — POCT INR: INR: 2.4

## 2011-11-30 ENCOUNTER — Ambulatory Visit
Admission: RE | Admit: 2011-11-30 | Discharge: 2011-11-30 | Disposition: A | Discharge status: Home / Self Care | Payer: Medicare Other | Source: Ambulatory Visit | Attending: Surgery | Admitting: Surgery

## 2011-11-30 DIAGNOSIS — Z1231 Encounter for screening mammogram for malignant neoplasm of breast: Secondary | ICD-10-CM

## 2011-11-30 DIAGNOSIS — Z853 Personal history of malignant neoplasm of breast: Secondary | ICD-10-CM

## 2012-01-04 ENCOUNTER — Ambulatory Visit (INDEPENDENT_AMBULATORY_CARE_PROVIDER_SITE_OTHER): Payer: Medicare Other | Admitting: *Deleted

## 2012-01-04 DIAGNOSIS — Z7901 Long term (current) use of anticoagulants: Secondary | ICD-10-CM

## 2012-01-04 DIAGNOSIS — I4891 Unspecified atrial fibrillation: Secondary | ICD-10-CM

## 2012-01-30 ENCOUNTER — Telehealth: Payer: Self-pay | Admitting: Cardiovascular Disease

## 2012-01-30 ENCOUNTER — Other Ambulatory Visit: Payer: Self-pay | Admitting: *Deleted

## 2012-01-30 MED ORDER — DIGOXIN 125 MCG PO TABS: 125.0000 ug | ORAL_TABLET | Freq: Every day | ORAL | Status: DC

## 2012-01-30 MED ORDER — FLECAINIDE ACETATE 100 MG PO TABS: 100.0000 mg | ORAL_TABLET | Freq: Two times a day (BID) | ORAL | Status: DC

## 2012-01-30 NOTE — Telephone Encounter (Signed)
Medication refilled, pt was taking 1/2 tablet 0.25 mg tablet, she wanted to stop cutting the pill in half, new strength ordered 0.125 mg daily

## 2012-01-30 NOTE — Telephone Encounter (Signed)
Fax Received. Refill Completed. Candice Schultz (R.M.A)   

## 2012-01-30 NOTE — Telephone Encounter (Signed)
plz return call to pt at hm# to discuss medication.  °

## 2012-01-31 ENCOUNTER — Other Ambulatory Visit: Payer: Self-pay

## 2012-01-31 MED ORDER — WARFARIN SODIUM 5 MG PO TABS: ORAL_TABLET | ORAL | Status: DC

## 2012-02-15 ENCOUNTER — Ambulatory Visit (INDEPENDENT_AMBULATORY_CARE_PROVIDER_SITE_OTHER): Payer: Medicare Other | Admitting: *Deleted

## 2012-02-15 DIAGNOSIS — Z7901 Long term (current) use of anticoagulants: Secondary | ICD-10-CM

## 2012-02-15 DIAGNOSIS — I4891 Unspecified atrial fibrillation: Secondary | ICD-10-CM

## 2012-03-28 ENCOUNTER — Ambulatory Visit (INDEPENDENT_AMBULATORY_CARE_PROVIDER_SITE_OTHER): Payer: Medicare Other | Admitting: *Deleted

## 2012-03-28 DIAGNOSIS — Z7901 Long term (current) use of anticoagulants: Secondary | ICD-10-CM

## 2012-03-28 DIAGNOSIS — I4891 Unspecified atrial fibrillation: Secondary | ICD-10-CM

## 2012-03-28 LAB — POCT INR: INR: 2.2

## 2012-05-09 ENCOUNTER — Ambulatory Visit (INDEPENDENT_AMBULATORY_CARE_PROVIDER_SITE_OTHER): Payer: Medicare Other | Admitting: *Deleted

## 2012-05-09 DIAGNOSIS — I4891 Unspecified atrial fibrillation: Secondary | ICD-10-CM

## 2012-05-09 DIAGNOSIS — Z7901 Long term (current) use of anticoagulants: Secondary | ICD-10-CM

## 2012-05-09 LAB — POCT INR: INR: 4.5

## 2012-05-23 ENCOUNTER — Ambulatory Visit (INDEPENDENT_AMBULATORY_CARE_PROVIDER_SITE_OTHER): Payer: Medicare Other | Admitting: *Deleted

## 2012-05-23 DIAGNOSIS — I4891 Unspecified atrial fibrillation: Secondary | ICD-10-CM

## 2012-05-23 DIAGNOSIS — Z7901 Long term (current) use of anticoagulants: Secondary | ICD-10-CM

## 2012-05-23 LAB — POCT INR: INR: 2.5

## 2012-06-04 ENCOUNTER — Other Ambulatory Visit: Payer: Self-pay | Admitting: *Deleted

## 2012-06-04 MED ORDER — FLECAINIDE ACETATE 100 MG PO TABS: 100.0000 mg | ORAL_TABLET | Freq: Two times a day (BID) | ORAL | Status: DC

## 2012-06-06 ENCOUNTER — Ambulatory Visit (INDEPENDENT_AMBULATORY_CARE_PROVIDER_SITE_OTHER): Payer: Medicare Other | Admitting: *Deleted

## 2012-06-06 DIAGNOSIS — Z7901 Long term (current) use of anticoagulants: Secondary | ICD-10-CM

## 2012-06-06 DIAGNOSIS — I4891 Unspecified atrial fibrillation: Secondary | ICD-10-CM

## 2012-06-06 LAB — POCT INR: INR: 2.4

## 2012-06-10 ENCOUNTER — Encounter: Payer: Self-pay | Admitting: Cardiovascular Disease

## 2012-06-12 ENCOUNTER — Encounter: Payer: Self-pay | Admitting: Cardiovascular Disease

## 2012-06-12 ENCOUNTER — Telehealth: Payer: Self-pay | Admitting: Cardiovascular Disease

## 2012-06-12 NOTE — Telephone Encounter (Signed)
Spoke with pt.  She normally takes her Coumadin in the morning but forgot it yesterday and took it around 8pm.  She wanted to make sure it was okay to go back to her regular schedule today.  Informed pt this would be fine.

## 2012-06-12 NOTE — Telephone Encounter (Signed)
New Problem:    Patient called in wanting to speak with our coumadin clinic about her INR levels.  Please call back.

## 2012-07-07 ENCOUNTER — Ambulatory Visit (INDEPENDENT_AMBULATORY_CARE_PROVIDER_SITE_OTHER): Payer: Medicare Other | Admitting: *Deleted

## 2012-07-07 DIAGNOSIS — I4891 Unspecified atrial fibrillation: Secondary | ICD-10-CM

## 2012-07-07 DIAGNOSIS — Z7901 Long term (current) use of anticoagulants: Secondary | ICD-10-CM

## 2012-07-07 LAB — POCT INR: INR: 2.3

## 2012-08-18 ENCOUNTER — Ambulatory Visit (INDEPENDENT_AMBULATORY_CARE_PROVIDER_SITE_OTHER): Payer: Medicare Other | Admitting: *Deleted

## 2012-08-18 DIAGNOSIS — I4891 Unspecified atrial fibrillation: Secondary | ICD-10-CM

## 2012-08-18 DIAGNOSIS — Z7901 Long term (current) use of anticoagulants: Secondary | ICD-10-CM

## 2012-09-03 ENCOUNTER — Other Ambulatory Visit: Payer: Self-pay | Admitting: Cardiovascular Disease

## 2012-09-08 ENCOUNTER — Ambulatory Visit (INDEPENDENT_AMBULATORY_CARE_PROVIDER_SITE_OTHER): Payer: Medicare Other | Admitting: Cardiovascular Disease

## 2012-09-08 ENCOUNTER — Encounter: Payer: Self-pay | Admitting: Cardiovascular Disease

## 2012-09-08 VITALS — BP 142/80 | HR 63 | Ht 66.0 in | Wt 132.0 lb

## 2012-09-08 DIAGNOSIS — I4891 Unspecified atrial fibrillation: Secondary | ICD-10-CM

## 2012-09-08 DIAGNOSIS — Z7901 Long term (current) use of anticoagulants: Secondary | ICD-10-CM

## 2012-09-08 DIAGNOSIS — E785 Hyperlipidemia, unspecified: Secondary | ICD-10-CM

## 2012-09-08 NOTE — Assessment & Plan Note (Addendum)
Candice Schultz is doing well.  She remains in NSR.  Continue current meds.   INR levels are theraputic.  I will see  her again in one year for followup office visit, EKG. She has her lipid  labs checked her medical doctor's office.

## 2012-09-08 NOTE — Assessment & Plan Note (Addendum)
She does not tolerate pravachol.

## 2012-09-08 NOTE — Patient Instructions (Addendum)
Your physician wants you to follow-up in: 1 year You will receive a reminder letter in the mail two months in advance. If you don't receive a letter, please call our office to schedule the follow-up appointment.  Please bring [place] a copy of your cholesterol results on your next visit.

## 2012-09-08 NOTE — Progress Notes (Signed)
Candice Schultz Date of Birth  [birth year] [facility] [address]     [address] [place], [place]  [address] [phone]  Fax  [phone]  Problem List: 1. Atrial Fibrillation 2. Chest pain - normal coronary arteries 2010 3.  hyperlipidemia 4. Hx of Breast cancer - 1999  History of Present Illness:  Candice Schultz has done well.  Her rhythm has remained stable. She continues to have constipation due to the Flecainide.  She takes propranolol occasionally .  She is exercising on occasion.  09/08/2012  Candice Schultz is doing well.  She has been having some memory loss and she has been holding the pravachol.  Her last LDL at Dr., Jonny Ruiz Jone Baseman office was 122.  Total chol was 227. Trigs = 151  She remains active.  She has occasional palpitations and takes PRN propranolol.    Current Outpatient Prescriptions on File Prior to Visit  Medication Sig Dispense Refill  . amLODipine (NORVASC) 2.5 MG tablet Take 2.5 mg by mouth daily.      Marland Kitchen aspirin 81 MG tablet Take 81 mg by mouth daily.        . calcium carbonate (OS-CAL) 600 MG TABS Take 600 mg by mouth 2 (two) times daily with a meal.        . digoxin (LANOXIN) 0.125 MG tablet Take 1 tablet (125 mcg total) by mouth daily.  90 tablet  1  . docusate sodium (COLACE) 100 MG capsule Take 100 mg by mouth 2 (two) times daily.        . flecainide (TAMBOCOR) 100 MG tablet Take 1 tablet (100 mg total) by mouth 2 (two) times daily.  60 tablet  3  . MECLIZINE HCL PO Take by mouth as needed.        . Multiple Vitamin (MULTIVITAMIN) tablet Take 1 tablet by mouth daily.        . propranolol (INDERAL) 10 MG tablet Take one tablet up to 4 times a day as needed  360 tablet  0  . sodium chloride (OCEAN) 0.65 % nasal spray 1 spray by Nasal route as needed.        . warfarin (COUMADIN) 5 MG tablet TAKE AS DIRECTED BY ANTICOAGULATION CLINIC  135 tablet  1   No current facility-administered medications on file prior to visit.     Allergies  Allergen Reactions  . Penicillins   . Propafenone Hcl Er     Past Medical History  Diagnosis Date  . AF (atrial fibrillation)   . Chest pain   . Hyperlipidemia   . Cancer     breast  . Heart palpitations     related for flecainide and due to having afib   . Constipation     Past Surgical History  Procedure Laterality Date  . Cardiac catheterization  08/2008    NORMAL CORONARY ARTERIES    History  Smoking status  . Former Smoker  . Quit date: 02/27/1983  Smokeless tobacco  . Never Used    History  Alcohol Use No    Family History  Problem Relation Age of Onset  . Cancer Mother     Reviw of Systems:  Reviewed in the HPI.  All other systems are negative.  Physical Exam: BP 142/80  Pulse 63  Ht 5\' 6"  (1.676 m)  Wt 132 lb (59.875 kg)  BMI 21.32 kg/m2 The patient is alert and oriented x 3.  The mood and affect are normal.  The  skin is warm and dry.  Color is normal.  The HEENT exam reveals that the sclera are nonicteric.  The mucous membranes are moist.  The carotids are 2+ without bruits.  There is no thyromegaly.  There is no JVD.  The lungs are clear.  The chest wall is non tender.  The heart exam reveals a regular rate with a normal S1 and S2.  She has a 1-2/6 systolic murmur.  The PMI is not displaced.   Abdominal exam reveals good bowel sounds.  There is no guarding or rebound.  There is no hepatosplenomegaly or tenderness.  There are no masses.  Exam of the legs reveal no clubbing, cyanosis, or edema.  The legs are without rashes.  The distal pulses are intact.  Cranial nerves II - XII are intact.  Motor and sensory functions are intact.  The gait is normal.  ECG: 09/08/2012: Normal sinus rhythm at 63 beats a minute. She has a first degree AV block. She has ST and T wave changes in the lateral leads.  The EKG is unchanged from previous tracings.   Assessment / Plan:

## 2012-09-29 ENCOUNTER — Ambulatory Visit (INDEPENDENT_AMBULATORY_CARE_PROVIDER_SITE_OTHER): Payer: Medicare Other | Admitting: *Deleted

## 2012-09-29 DIAGNOSIS — I4891 Unspecified atrial fibrillation: Secondary | ICD-10-CM

## 2012-09-29 DIAGNOSIS — Z7901 Long term (current) use of anticoagulants: Secondary | ICD-10-CM

## 2012-10-04 ENCOUNTER — Other Ambulatory Visit: Payer: Self-pay | Admitting: Cardiovascular Disease

## 2012-10-06 NOTE — Telephone Encounter (Signed)
Fax Received. Refill Completed. Candice Schultz (R.M.A)   

## 2012-11-06 ENCOUNTER — Other Ambulatory Visit: Payer: Self-pay

## 2012-11-06 DIAGNOSIS — Z1231 Encounter for screening mammogram for malignant neoplasm of breast: Secondary | ICD-10-CM

## 2012-11-10 ENCOUNTER — Ambulatory Visit (INDEPENDENT_AMBULATORY_CARE_PROVIDER_SITE_OTHER): Payer: Medicare Other | Admitting: *Deleted

## 2012-11-10 DIAGNOSIS — Z7901 Long term (current) use of anticoagulants: Secondary | ICD-10-CM

## 2012-11-10 DIAGNOSIS — I4891 Unspecified atrial fibrillation: Secondary | ICD-10-CM

## 2012-12-01 ENCOUNTER — Ambulatory Visit
Admission: RE | Admit: 2012-12-01 | Discharge: 2012-12-01 | Disposition: A | Discharge status: Home / Self Care | Payer: Medicare Other | Source: Ambulatory Visit

## 2012-12-01 DIAGNOSIS — Z1231 Encounter for screening mammogram for malignant neoplasm of breast: Secondary | ICD-10-CM

## 2012-12-08 ENCOUNTER — Ambulatory Visit (INDEPENDENT_AMBULATORY_CARE_PROVIDER_SITE_OTHER): Payer: Medicare Other | Admitting: *Deleted

## 2012-12-08 DIAGNOSIS — I4891 Unspecified atrial fibrillation: Secondary | ICD-10-CM

## 2012-12-08 DIAGNOSIS — Z7901 Long term (current) use of anticoagulants: Secondary | ICD-10-CM

## 2012-12-15 ENCOUNTER — Encounter: Payer: Self-pay | Admitting: *Deleted

## 2012-12-15 ENCOUNTER — Telehealth: Payer: Self-pay | Admitting: *Deleted

## 2012-12-15 NOTE — Telephone Encounter (Signed)
Pt had heart cath// no stent, no blockage per pt. Pt has non cardiac issues.

## 2012-12-15 NOTE — Telephone Encounter (Signed)
This encounter was created in error - please disregard.

## 2012-12-15 NOTE — Telephone Encounter (Signed)
Message copied by Antony Odea on Mon Dec 15, 2012  9:15 AM ------      Message from: MUSE, [place] J      Created: Fri Dec 12, 2012  3:28 PM      Regarding: ER Visit       Pt went to ER in [place] on 12/10/12 for chest pain, had Cath on 12/11/12. Please call her she has questions.  ------

## 2013-01-05 ENCOUNTER — Ambulatory Visit (INDEPENDENT_AMBULATORY_CARE_PROVIDER_SITE_OTHER): Payer: Medicare Other | Admitting: *Deleted

## 2013-01-05 DIAGNOSIS — I4891 Unspecified atrial fibrillation: Secondary | ICD-10-CM

## 2013-01-05 DIAGNOSIS — Z7901 Long term (current) use of anticoagulants: Secondary | ICD-10-CM

## 2013-01-05 LAB — POCT INR: INR: 2.9

## 2013-01-10 ENCOUNTER — Other Ambulatory Visit: Payer: Self-pay | Admitting: Cardiovascular Disease

## 2013-02-02 ENCOUNTER — Ambulatory Visit (INDEPENDENT_AMBULATORY_CARE_PROVIDER_SITE_OTHER): Payer: Medicare Other | Admitting: *Deleted

## 2013-02-02 DIAGNOSIS — Z7901 Long term (current) use of anticoagulants: Secondary | ICD-10-CM

## 2013-02-02 DIAGNOSIS — I4891 Unspecified atrial fibrillation: Secondary | ICD-10-CM

## 2013-02-06 ENCOUNTER — Other Ambulatory Visit: Payer: Self-pay

## 2013-02-06 MED ORDER — FLECAINIDE ACETATE 100 MG PO TABS: ORAL_TABLET | ORAL | Status: DC

## 2013-03-12 ENCOUNTER — Ambulatory Visit (INDEPENDENT_AMBULATORY_CARE_PROVIDER_SITE_OTHER): Payer: Medicare Other | Admitting: *Deleted

## 2013-03-12 DIAGNOSIS — Z7901 Long term (current) use of anticoagulants: Secondary | ICD-10-CM

## 2013-03-12 DIAGNOSIS — I4891 Unspecified atrial fibrillation: Secondary | ICD-10-CM

## 2013-03-12 LAB — POCT INR: INR: 2.4

## 2013-03-28 ENCOUNTER — Other Ambulatory Visit: Payer: Self-pay | Admitting: Cardiovascular Disease

## 2013-04-24 ENCOUNTER — Ambulatory Visit (INDEPENDENT_AMBULATORY_CARE_PROVIDER_SITE_OTHER): Payer: Medicare Other | Admitting: *Deleted

## 2013-04-24 DIAGNOSIS — I4891 Unspecified atrial fibrillation: Secondary | ICD-10-CM

## 2013-04-24 DIAGNOSIS — Z7901 Long term (current) use of anticoagulants: Secondary | ICD-10-CM

## 2013-04-24 DIAGNOSIS — Z5181 Encounter for therapeutic drug level monitoring: Secondary | ICD-10-CM | POA: Insufficient documentation

## 2013-04-24 LAB — POCT INR: INR: 2.7

## 2013-06-05 ENCOUNTER — Ambulatory Visit (INDEPENDENT_AMBULATORY_CARE_PROVIDER_SITE_OTHER): Payer: Medicare Other

## 2013-06-05 DIAGNOSIS — Z5181 Encounter for therapeutic drug level monitoring: Secondary | ICD-10-CM

## 2013-06-05 DIAGNOSIS — Z7901 Long term (current) use of anticoagulants: Secondary | ICD-10-CM

## 2013-06-05 DIAGNOSIS — I4891 Unspecified atrial fibrillation: Secondary | ICD-10-CM

## 2013-06-05 LAB — POCT INR: INR: 2.5

## 2013-07-17 ENCOUNTER — Ambulatory Visit (INDEPENDENT_AMBULATORY_CARE_PROVIDER_SITE_OTHER): Payer: Medicare Other | Admitting: *Deleted

## 2013-07-17 DIAGNOSIS — I4891 Unspecified atrial fibrillation: Secondary | ICD-10-CM

## 2013-07-17 DIAGNOSIS — Z5181 Encounter for therapeutic drug level monitoring: Secondary | ICD-10-CM

## 2013-07-17 DIAGNOSIS — Z7901 Long term (current) use of anticoagulants: Secondary | ICD-10-CM

## 2013-07-17 LAB — POCT INR: INR: 2.5

## 2013-08-04 ENCOUNTER — Other Ambulatory Visit: Payer: Self-pay | Admitting: Cardiovascular Disease

## 2013-08-27 ENCOUNTER — Ambulatory Visit (INDEPENDENT_AMBULATORY_CARE_PROVIDER_SITE_OTHER): Payer: Medicare Other | Admitting: *Deleted

## 2013-08-27 DIAGNOSIS — I4891 Unspecified atrial fibrillation: Secondary | ICD-10-CM

## 2013-08-27 DIAGNOSIS — Z7901 Long term (current) use of anticoagulants: Secondary | ICD-10-CM

## 2013-08-27 DIAGNOSIS — Z5181 Encounter for therapeutic drug level monitoring: Secondary | ICD-10-CM

## 2013-08-27 LAB — POCT INR: INR: 2

## 2013-09-10 ENCOUNTER — Ambulatory Visit (INDEPENDENT_AMBULATORY_CARE_PROVIDER_SITE_OTHER): Payer: Medicare Other | Admitting: Cardiovascular Disease

## 2013-09-10 ENCOUNTER — Encounter: Payer: Self-pay | Admitting: Cardiovascular Disease

## 2013-09-10 VITALS — BP 120/76 | HR 68 | Ht 66.0 in | Wt 124.6 lb

## 2013-09-10 DIAGNOSIS — I4891 Unspecified atrial fibrillation: Secondary | ICD-10-CM

## 2013-09-10 DIAGNOSIS — R634 Abnormal weight loss: Secondary | ICD-10-CM

## 2013-09-10 DIAGNOSIS — I480 Paroxysmal atrial fibrillation: Secondary | ICD-10-CM

## 2013-09-10 DIAGNOSIS — E785 Hyperlipidemia, unspecified: Secondary | ICD-10-CM

## 2013-09-10 LAB — TSH: TSH: 1.66 u[IU]/mL (ref 0.35–4.50)

## 2013-09-10 NOTE — Assessment & Plan Note (Signed)
She has tolerated the Flecainide without problems . She has maintained sinus rhythm. She still takes propranolol as needed. Will see her in 1 year.

## 2013-09-10 NOTE — Patient Instructions (Signed)
Your physician recommends that you continue on your current medications as directed. Please refer to the Current Medication list given to you today.  Your physician recommends that you have lab work:  TODAY to check thyroid  Your physician wants you to follow-up in: 1 year with Dr. Acie Fredrickson.  You will receive a reminder letter in the mail two months in advance. If you don't receive a letter, please call our office to schedule the follow-up appointment.

## 2013-09-10 NOTE — Progress Notes (Signed)
Candice Schultz Date of Birth  [birth year] [facility] / [facility] [address]     [place], [place]  [address] [phone]  Fax  [phone]  Problem List: 1. Atrial Fibrillation 2. Chest pain - normal coronary arteries 2010 3.  hyperlipidemia 4. Hx of Breast cancer - 1999  History of Present Illness:  Candice Schultz has done well.  Her rhythm has remained stable. She continues to have constipation due to the Flecainide.  She takes propranolol occasionally .  She is exercising on occasion.  09/08/2012  Candice Schultz is doing well.  She has been having some memory loss and she has been holding the pravachol.  Her last LDL at Dr., Jenny Reichmann Delene Ruffini office was 122.  Total chol was 227. Trigs = 151  She remains active.  She has occasional palpitations and takes PRN propranolol.    September 10, 2013:  Candice Schultz is doing OK.  She has white coat HTN .  BP at  Home is always ok.  BP of 110 typically.   Staying in NSR. No CP or dyspnea.  She has bee losing  weight without any explanation   She had an episode CP while in [place] on Oct.   Was taken to [facility] center - cardiac cath looked great.    Current Outpatient Prescriptions on File Prior to Visit  Medication Sig Dispense Refill  . aspirin 81 MG tablet Take 81 mg by mouth daily.        . calcium carbonate (OS-CAL) 600 MG TABS Take 600 mg by mouth 2 (two) times daily with a meal.        . DIGOX 125 MCG tablet TAKE 1 TABLET BY MOUTH DAILY  90 tablet  2  . docusate sodium (COLACE) 100 MG capsule Take 100 mg by mouth 2 (two) times daily.        . flecainide (TAMBOCOR) 100 MG tablet TAKE 1 TABLET BY MOUTH TWICE DAILY  60 tablet  5  . Multiple Vitamin (MULTIVITAMIN) tablet Take 1 tablet by mouth daily.        . propranolol (INDERAL) 10 MG tablet Take one tablet up to 4 times a day as needed  360 tablet  0  . warfarin (COUMADIN) 5 MG tablet TAKE AS DIRECTED  135 tablet  1   No current facility-administered medications  on file prior to visit.    Allergies  Allergen Reactions  . Penicillins   . Propafenone Hcl Er     Past Medical History  Diagnosis Date  . AF (atrial fibrillation)   . Chest pain   . Hyperlipidemia   . Cancer     breast  . Heart palpitations     related for flecainide and due to having afib   . Constipation     Past Surgical History  Procedure Laterality Date  . Cardiac catheterization  08/2008    NORMAL CORONARY ARTERIES    History  Smoking status  . Former Smoker  . Quit date: 02/27/1983  Smokeless tobacco  . Never Used    History  Alcohol Use No    Family History  Problem Relation Age of Onset  . Cancer Mother     Reviw of Systems:  Reviewed in the HPI.  All other systems are negative.  Physical Exam: BP 120/76  Pulse 68  Ht 5\' 6"  (1.676 m)  Wt 124 lb 9.6 oz (56.518 kg)  BMI 20.12 kg/m2 The patient is alert and oriented x  3.  The mood and affect are normal.  The skin is warm and dry.  Color is normal.  The HEENT exam reveals that the sclera are nonicteric.  The mucous membranes are moist.  The carotids are 2+ without bruits.  There is no thyromegaly.  There is no JVD.  The lungs are clear.  The chest wall is non tender.  The heart exam reveals a regular rate with a normal S1 and S2.  She has a 5-6/4 systolic murmur.  The PMI is not displaced.   Abdominal exam reveals good bowel sounds.  There is no guarding or rebound.  There is no hepatosplenomegaly or tenderness.  There are no masses.  Exam of the legs reveal no clubbing, cyanosis, or edema.  The legs are without rashes.  The distal pulses are intact.  Cranial nerves II - XII are intact.  Motor and sensory functions are intact.  The gait is normal.  ECG: 09/08/2012: Normal sinus rhythm at 63 beats a minute. She has a first degree AV block. She has ST and T wave changes in the lateral leads.  The EKG is unchanged from previous tracings.   Assessment / Plan:

## 2013-09-22 ENCOUNTER — Telehealth: Payer: Self-pay | Admitting: Cardiovascular Disease

## 2013-09-22 NOTE — Telephone Encounter (Signed)
Returned call to pt, pt states she missed yesterday's dosage of Coumadin.  Missed a 5mg  dosage yesterday is scheduled to take 7.5mg  today, last INR 2.0 on 08/27/13 advised pt to take 10mg  today, then resume normal dosage 7.5mg  qd except 5mg  on Mondays and Thursdays.  Advised to keep scheduled f/u 10/08/13.

## 2013-09-22 NOTE — Telephone Encounter (Signed)
Patient missed a coumadin pill. Please call and advise.

## 2013-10-05 ENCOUNTER — Other Ambulatory Visit: Payer: Self-pay | Admitting: Cardiovascular Disease

## 2013-10-08 ENCOUNTER — Ambulatory Visit (INDEPENDENT_AMBULATORY_CARE_PROVIDER_SITE_OTHER): Payer: Medicare Other | Admitting: *Deleted

## 2013-10-08 DIAGNOSIS — I4891 Unspecified atrial fibrillation: Secondary | ICD-10-CM

## 2013-10-08 DIAGNOSIS — Z5181 Encounter for therapeutic drug level monitoring: Secondary | ICD-10-CM

## 2013-10-08 DIAGNOSIS — Z7901 Long term (current) use of anticoagulants: Secondary | ICD-10-CM

## 2013-10-08 LAB — POCT INR: INR: 2.4

## 2013-10-12 ENCOUNTER — Other Ambulatory Visit: Payer: Self-pay | Admitting: Cardiovascular Disease

## 2013-11-03 ENCOUNTER — Other Ambulatory Visit: Payer: Self-pay

## 2013-11-03 DIAGNOSIS — Z1231 Encounter for screening mammogram for malignant neoplasm of breast: Secondary | ICD-10-CM

## 2013-11-04 ENCOUNTER — Telehealth: Payer: Self-pay | Admitting: Cardiovascular Disease

## 2013-11-04 NOTE — Telephone Encounter (Signed)
Test results reviewed with patient who verbalized understanding and gratitude

## 2013-11-04 NOTE — Telephone Encounter (Signed)
New message     Pt want thyroid results from July appt

## 2013-11-17 ENCOUNTER — Ambulatory Visit (INDEPENDENT_AMBULATORY_CARE_PROVIDER_SITE_OTHER): Payer: Medicare Other | Admitting: *Deleted

## 2013-11-17 DIAGNOSIS — Z7901 Long term (current) use of anticoagulants: Secondary | ICD-10-CM

## 2013-11-17 DIAGNOSIS — Z5181 Encounter for therapeutic drug level monitoring: Secondary | ICD-10-CM

## 2013-11-17 DIAGNOSIS — I4891 Unspecified atrial fibrillation: Secondary | ICD-10-CM

## 2013-11-17 LAB — POCT INR: INR: 2.7

## 2013-12-03 ENCOUNTER — Telehealth: Payer: Self-pay | Admitting: Cardiovascular Disease

## 2013-12-03 ENCOUNTER — Ambulatory Visit
Admission: RE | Admit: 2013-12-03 | Discharge: 2013-12-03 | Disposition: A | Discharge status: Home / Self Care | Payer: Medicare Other | Source: Ambulatory Visit

## 2013-12-03 DIAGNOSIS — Z1231 Encounter for screening mammogram for malignant neoplasm of breast: Secondary | ICD-10-CM

## 2013-12-03 NOTE — Telephone Encounter (Signed)
Returned call to pt, pt states she missed her Tuesday 7.5mg  dosage of Coumadin, forgot to call yesterday took normal scheduled 7.5mg  dosage of Coumadin yesterday.  Last INR 2.7 on 11/17/13.  Pt would normally take 5mg  today, advised pt to take 7.5mg  today, then resume previous dosage regimen 7.5mg  daily except 5mg  on Mondays and Thursdays.  Keep scheduled f/u appt.

## 2013-12-03 NOTE — Telephone Encounter (Signed)
New message     Pt forgot to take her coumadin Tuesday but took wed dosage. Please advise on dosage

## 2013-12-29 ENCOUNTER — Ambulatory Visit (INDEPENDENT_AMBULATORY_CARE_PROVIDER_SITE_OTHER): Payer: Medicare Other

## 2013-12-29 DIAGNOSIS — Z5181 Encounter for therapeutic drug level monitoring: Secondary | ICD-10-CM

## 2013-12-29 DIAGNOSIS — Z7901 Long term (current) use of anticoagulants: Secondary | ICD-10-CM

## 2013-12-29 DIAGNOSIS — I4891 Unspecified atrial fibrillation: Secondary | ICD-10-CM

## 2013-12-29 LAB — POCT INR: INR: 2.1

## 2014-01-07 ENCOUNTER — Other Ambulatory Visit: Payer: Self-pay | Admitting: Cardiovascular Disease

## 2014-01-24 ENCOUNTER — Other Ambulatory Visit: Payer: Self-pay | Admitting: Cardiovascular Disease

## 2014-02-09 ENCOUNTER — Ambulatory Visit (INDEPENDENT_AMBULATORY_CARE_PROVIDER_SITE_OTHER): Payer: Medicare Other | Admitting: *Deleted

## 2014-02-09 DIAGNOSIS — I4891 Unspecified atrial fibrillation: Secondary | ICD-10-CM

## 2014-02-09 DIAGNOSIS — Z5181 Encounter for therapeutic drug level monitoring: Secondary | ICD-10-CM

## 2014-02-09 DIAGNOSIS — Z7901 Long term (current) use of anticoagulants: Secondary | ICD-10-CM

## 2014-02-09 LAB — POCT INR: INR: 1.9

## 2014-02-11 ENCOUNTER — Other Ambulatory Visit: Payer: Self-pay | Admitting: Internal Medicine

## 2014-02-11 DIAGNOSIS — E222 Syndrome of inappropriate secretion of antidiuretic hormone: Secondary | ICD-10-CM

## 2014-02-12 ENCOUNTER — Ambulatory Visit
Admission: RE | Admit: 2014-02-12 | Discharge: 2014-02-12 | Disposition: A | Discharge status: Home / Self Care | Payer: Medicare Other | Source: Ambulatory Visit | Attending: Internal Medicine | Admitting: Internal Medicine

## 2014-02-12 DIAGNOSIS — E222 Syndrome of inappropriate secretion of antidiuretic hormone: Secondary | ICD-10-CM

## 2014-02-12 MED ORDER — IOHEXOL 300 MG/ML  SOLN
75.0000 mL | Freq: Once | INTRAMUSCULAR | Status: AC | PRN
  Administered 2014-02-12: 75 mL via INTRAVENOUS

## 2014-03-09 ENCOUNTER — Ambulatory Visit (INDEPENDENT_AMBULATORY_CARE_PROVIDER_SITE_OTHER): Payer: Medicare Other | Admitting: *Deleted

## 2014-03-09 DIAGNOSIS — Z7901 Long term (current) use of anticoagulants: Secondary | ICD-10-CM

## 2014-03-09 DIAGNOSIS — Z5181 Encounter for therapeutic drug level monitoring: Secondary | ICD-10-CM

## 2014-03-09 DIAGNOSIS — I4891 Unspecified atrial fibrillation: Secondary | ICD-10-CM

## 2014-03-09 LAB — POCT INR: INR: 2.1

## 2014-04-19 ENCOUNTER — Other Ambulatory Visit: Payer: Self-pay

## 2014-04-19 ENCOUNTER — Other Ambulatory Visit: Payer: Self-pay | Admitting: *Deleted

## 2014-04-19 MED ORDER — DIGOXIN 125 MCG PO TABS: 125.0000 ug | ORAL_TABLET | Freq: Every day | ORAL | Status: DC

## 2014-04-20 ENCOUNTER — Ambulatory Visit (INDEPENDENT_AMBULATORY_CARE_PROVIDER_SITE_OTHER): Payer: Medicare Other | Admitting: *Deleted

## 2014-04-20 DIAGNOSIS — I4891 Unspecified atrial fibrillation: Secondary | ICD-10-CM

## 2014-04-20 DIAGNOSIS — Z5181 Encounter for therapeutic drug level monitoring: Secondary | ICD-10-CM

## 2014-04-20 DIAGNOSIS — Z7901 Long term (current) use of anticoagulants: Secondary | ICD-10-CM

## 2014-04-20 LAB — POCT INR: INR: 2.6

## 2014-05-06 ENCOUNTER — Other Ambulatory Visit: Payer: Self-pay | Admitting: Cardiovascular Disease

## 2014-06-03 ENCOUNTER — Ambulatory Visit (INDEPENDENT_AMBULATORY_CARE_PROVIDER_SITE_OTHER): Payer: Medicare Other | Admitting: *Deleted

## 2014-06-03 DIAGNOSIS — I4891 Unspecified atrial fibrillation: Secondary | ICD-10-CM | POA: Diagnosis not present

## 2014-06-03 DIAGNOSIS — Z5181 Encounter for therapeutic drug level monitoring: Secondary | ICD-10-CM | POA: Diagnosis not present

## 2014-06-03 DIAGNOSIS — Z7901 Long term (current) use of anticoagulants: Secondary | ICD-10-CM | POA: Diagnosis not present

## 2014-06-03 LAB — POCT INR: INR: 2.4

## 2014-07-09 ENCOUNTER — Ambulatory Visit (INDEPENDENT_AMBULATORY_CARE_PROVIDER_SITE_OTHER): Payer: Medicare Other | Admitting: *Deleted

## 2014-07-09 DIAGNOSIS — I4891 Unspecified atrial fibrillation: Secondary | ICD-10-CM

## 2014-07-09 DIAGNOSIS — Z7901 Long term (current) use of anticoagulants: Secondary | ICD-10-CM

## 2014-07-09 DIAGNOSIS — Z5181 Encounter for therapeutic drug level monitoring: Secondary | ICD-10-CM

## 2014-07-09 LAB — POCT INR: INR: 1.9

## 2014-07-23 ENCOUNTER — Ambulatory Visit (INDEPENDENT_AMBULATORY_CARE_PROVIDER_SITE_OTHER): Payer: Medicare Other

## 2014-07-23 DIAGNOSIS — Z7901 Long term (current) use of anticoagulants: Secondary | ICD-10-CM

## 2014-07-23 DIAGNOSIS — Z5181 Encounter for therapeutic drug level monitoring: Secondary | ICD-10-CM

## 2014-07-23 DIAGNOSIS — I4891 Unspecified atrial fibrillation: Secondary | ICD-10-CM

## 2014-07-23 LAB — POCT INR: INR: 2

## 2014-08-12 ENCOUNTER — Other Ambulatory Visit: Payer: Self-pay | Admitting: *Deleted

## 2014-08-12 MED ORDER — WARFARIN SODIUM 5 MG PO TABS: 5.0000 mg | ORAL_TABLET | ORAL | Status: DC

## 2014-08-13 ENCOUNTER — Ambulatory Visit (INDEPENDENT_AMBULATORY_CARE_PROVIDER_SITE_OTHER): Payer: Medicare Other

## 2014-08-13 DIAGNOSIS — Z7901 Long term (current) use of anticoagulants: Secondary | ICD-10-CM | POA: Diagnosis not present

## 2014-08-13 DIAGNOSIS — I4891 Unspecified atrial fibrillation: Secondary | ICD-10-CM

## 2014-08-13 DIAGNOSIS — Z5181 Encounter for therapeutic drug level monitoring: Secondary | ICD-10-CM | POA: Diagnosis not present

## 2014-08-13 LAB — POCT INR: INR: 2.3

## 2014-09-10 ENCOUNTER — Ambulatory Visit (INDEPENDENT_AMBULATORY_CARE_PROVIDER_SITE_OTHER): Payer: Medicare Other | Admitting: *Deleted

## 2014-09-10 DIAGNOSIS — Z7901 Long term (current) use of anticoagulants: Secondary | ICD-10-CM

## 2014-09-10 DIAGNOSIS — Z5181 Encounter for therapeutic drug level monitoring: Secondary | ICD-10-CM | POA: Diagnosis not present

## 2014-09-10 DIAGNOSIS — I4891 Unspecified atrial fibrillation: Secondary | ICD-10-CM | POA: Diagnosis not present

## 2014-09-10 LAB — POCT INR: INR: 2.3

## 2014-09-20 ENCOUNTER — Encounter: Payer: Self-pay | Admitting: Cardiovascular Disease

## 2014-09-20 ENCOUNTER — Ambulatory Visit (INDEPENDENT_AMBULATORY_CARE_PROVIDER_SITE_OTHER): Payer: Medicare Other | Admitting: Cardiovascular Disease

## 2014-09-20 VITALS — BP 122/68 | HR 67 | Ht 66.0 in | Wt 123.0 lb

## 2014-09-20 DIAGNOSIS — I480 Paroxysmal atrial fibrillation: Secondary | ICD-10-CM

## 2014-09-20 DIAGNOSIS — I340 Nonrheumatic mitral (valve) insufficiency: Secondary | ICD-10-CM | POA: Diagnosis not present

## 2014-09-20 NOTE — Progress Notes (Signed)
Candice Schultz Date of Birth  1934-08-31 [facility] / [facility] [address]     [place], [place]  [address] [phone]  Fax  [phone]  Problem List: 1. Atrial Fibrillation 2. Chest pain - normal coronary arteries 2010 3.  hyperlipidemia 4. Hx of Breast cancer - 1999  History of Present Illness:  Candice Schultz has done well.  Her rhythm has remained stable. She continues to have constipation due to the Flecainide.  She takes propranolol occasionally .  She is exercising on occasion.  09/08/2012  Candice Schultz is doing well.  She has been having some memory loss and she has been holding the pravachol.  Her last LDL at Dr., Jenny Reichmann Delene Ruffini office was 122.  Total chol was 227. Trigs = 151  She remains active.  She has occasional palpitations and takes PRN propranolol.    September 10, 2013:  Candice Schultz is doing OK.  She has white coat HTN .  BP at  Home is always ok.  BP of 110 typically.   Staying in NSR. No CP or dyspnea.  She has bee losing  weight without any explanation   She had an episode CP while in [place] on Oct.   Was taken to [facility] center - cardiac cath looked great.    September 20, 2014:  Has had a good year. No CP or dyspnea  Able to do all of her normal activities without problems .  Is concerned about some weight loss Will be talking to Dr. Laurann Montana about it . Asked about   Current Outpatient Prescriptions on File Prior to Visit  Medication Sig Dispense Refill  . aspirin 81 MG tablet Take 81 mg by mouth daily.      . digoxin (DIGOX) 0.125 MG tablet Take 1 tablet (125 mcg total) by mouth daily. 90 tablet 0  . docusate sodium (COLACE) 100 MG capsule Take 100 mg by mouth daily as needed.     . flecainide (TAMBOCOR) 100 MG tablet TAKE 1 TABLET BY MOUTH TWICE DAILY 60 tablet 11  . Multiple Vitamin (MULTIVITAMIN) tablet Take 1 tablet by mouth daily.      . propranolol (INDERAL) 10 MG tablet Take one tablet up to 4 times a day as needed 360  tablet 0  . warfarin (COUMADIN) 5 MG tablet Take 1 tablet (5 mg total) by mouth as directed. 135 tablet 0   No current facility-administered medications on file prior to visit.    Allergies  Allergen Reactions  . Penicillins   . Propafenone Hcl Er     Past Medical History  Diagnosis Date  . AF (atrial fibrillation)   . Chest pain   . Hyperlipidemia   . Cancer     breast  . Heart palpitations     related for flecainide and due to having afib   . Constipation     Past Surgical History  Procedure Laterality Date  . Cardiac catheterization  08/2008    NORMAL CORONARY ARTERIES    History  Smoking status  . Former Smoker  . Quit date: 02/27/1983  Smokeless tobacco  . Never Used    History  Alcohol Use No    Family History  Problem Relation Age of Onset  . Cancer Mother     Reviw of Systems:  Reviewed in the HPI.  All other systems are negative.  Physical Exam: BP 122/68 mmHg  Pulse 67  Ht 5\' 6"  (1.676 m)  Wt 55.792 kg (  123 lb)  BMI 19.86 kg/m2 The patient is alert and oriented x 3.  The mood and affect are normal.  The skin is warm and dry.  Color is normal.  The HEENT exam reveals that the sclera are nonicteric.  The mucous membranes are moist.  The carotids are 2+ without bruits.  There is no thyromegaly.  There is no JVD.  The lungs are clear.  The chest wall is non tender.  The heart exam reveals a regular rate with a normal S1 and S2.  She has a 7-5/7 systolic murmur.  The PMI is not displaced.   Abdominal exam reveals good bowel sounds.  There is no guarding or rebound.  There is no hepatosplenomegaly or tenderness.  There are no masses.  Exam of the legs reveal no clubbing, cyanosis, or edema.  The legs are without rashes.  The distal pulses are intact.  Cranial nerves II - XII are intact.  Motor and sensory functions are intact.  The gait is normal.  ECG: September 20, 2014:  NSR at 19.  1st degree av block  TWI in the anterior leads.  Assessment / Plan:   1.  Atrial Fibrillation  - has maintained NSR  2. Chest pain - normal coronary arteries 2010 She's not had any further episodes of chest is covered. Continue current medications.  3.  Hyperlipidemia  4. Hx of Breast cancer - 1999    Candice Schultz, Wonda Cheng, MD  09/20/2014 3:16 PM    [address],  [place], [place]  [address] Pager [phone] Phone: [phone]; Fax: [phone]   [facility]  [address] [place], [place]  [address] [phone]   Fax [phone]

## 2014-09-20 NOTE — Patient Instructions (Signed)
Medication Instructions:  STOP Aspirin  Labwork: None Ordered   Testing/Procedures: None Ordered  Follow-Up: Your physician wants you to follow-up in: 1 year with Dr. Acie Fredrickson.  You will receive a reminder letter in the mail two months in advance. If you don't receive a letter, please call our office to schedule the follow-up appointment.

## 2014-10-08 ENCOUNTER — Ambulatory Visit (INDEPENDENT_AMBULATORY_CARE_PROVIDER_SITE_OTHER): Payer: Medicare Other

## 2014-10-08 DIAGNOSIS — Z7901 Long term (current) use of anticoagulants: Secondary | ICD-10-CM

## 2014-10-08 DIAGNOSIS — Z5181 Encounter for therapeutic drug level monitoring: Secondary | ICD-10-CM | POA: Diagnosis not present

## 2014-10-08 DIAGNOSIS — I480 Paroxysmal atrial fibrillation: Secondary | ICD-10-CM | POA: Diagnosis not present

## 2014-10-08 DIAGNOSIS — I4891 Unspecified atrial fibrillation: Secondary | ICD-10-CM

## 2014-10-08 LAB — POCT INR: INR: 2.6

## 2014-10-12 ENCOUNTER — Other Ambulatory Visit: Payer: Self-pay

## 2014-10-12 MED ORDER — FLECAINIDE ACETATE 100 MG PO TABS: 100.0000 mg | ORAL_TABLET | Freq: Two times a day (BID) | ORAL | Status: DC

## 2014-10-12 NOTE — Telephone Encounter (Signed)
Thayer Headings, MD at 09/20/2014 3:09 PM  flecainide (TAMBOCOR) 100 MG tablet TAKE 1 TABLET BY MOUTH TWICE DAILY         1. Atrial Fibrillation - has maintained NSR

## 2014-11-08 ENCOUNTER — Ambulatory Visit (INDEPENDENT_AMBULATORY_CARE_PROVIDER_SITE_OTHER): Payer: Medicare Other | Admitting: Pharmacist

## 2014-11-08 DIAGNOSIS — I480 Paroxysmal atrial fibrillation: Secondary | ICD-10-CM

## 2014-11-08 DIAGNOSIS — I4891 Unspecified atrial fibrillation: Secondary | ICD-10-CM | POA: Diagnosis not present

## 2014-11-08 DIAGNOSIS — Z7901 Long term (current) use of anticoagulants: Secondary | ICD-10-CM

## 2014-11-08 DIAGNOSIS — Z5181 Encounter for therapeutic drug level monitoring: Secondary | ICD-10-CM | POA: Diagnosis not present

## 2014-11-08 LAB — POCT INR: INR: 3.1

## 2014-11-15 ENCOUNTER — Other Ambulatory Visit: Payer: Self-pay

## 2014-11-15 DIAGNOSIS — Z1231 Encounter for screening mammogram for malignant neoplasm of breast: Secondary | ICD-10-CM

## 2014-11-25 ENCOUNTER — Other Ambulatory Visit: Payer: Self-pay | Admitting: Internal Medicine

## 2014-11-25 DIAGNOSIS — R911 Solitary pulmonary nodule: Secondary | ICD-10-CM

## 2014-11-29 ENCOUNTER — Other Ambulatory Visit: Payer: Self-pay

## 2014-11-29 MED ORDER — WARFARIN SODIUM 5 MG PO TABS: 5.0000 mg | ORAL_TABLET | ORAL | Status: DC

## 2014-12-06 ENCOUNTER — Ambulatory Visit (INDEPENDENT_AMBULATORY_CARE_PROVIDER_SITE_OTHER): Payer: Medicare Other | Admitting: *Deleted

## 2014-12-06 DIAGNOSIS — I480 Paroxysmal atrial fibrillation: Secondary | ICD-10-CM | POA: Diagnosis not present

## 2014-12-06 DIAGNOSIS — Z5181 Encounter for therapeutic drug level monitoring: Secondary | ICD-10-CM

## 2014-12-06 DIAGNOSIS — I4891 Unspecified atrial fibrillation: Secondary | ICD-10-CM | POA: Diagnosis not present

## 2014-12-06 DIAGNOSIS — Z7901 Long term (current) use of anticoagulants: Secondary | ICD-10-CM | POA: Diagnosis not present

## 2014-12-06 LAB — POCT INR: INR: 1.7

## 2014-12-07 ENCOUNTER — Ambulatory Visit
Admission: RE | Admit: 2014-12-07 | Discharge: 2014-12-07 | Disposition: A | Discharge status: Home / Self Care | Payer: Medicare Other | Source: Ambulatory Visit

## 2014-12-07 DIAGNOSIS — Z1231 Encounter for screening mammogram for malignant neoplasm of breast: Secondary | ICD-10-CM

## 2014-12-20 ENCOUNTER — Ambulatory Visit (INDEPENDENT_AMBULATORY_CARE_PROVIDER_SITE_OTHER): Payer: Medicare Other | Admitting: *Deleted

## 2014-12-20 DIAGNOSIS — I480 Paroxysmal atrial fibrillation: Secondary | ICD-10-CM

## 2014-12-20 DIAGNOSIS — Z5181 Encounter for therapeutic drug level monitoring: Secondary | ICD-10-CM

## 2014-12-20 DIAGNOSIS — Z7901 Long term (current) use of anticoagulants: Secondary | ICD-10-CM

## 2014-12-20 DIAGNOSIS — I4891 Unspecified atrial fibrillation: Secondary | ICD-10-CM

## 2014-12-20 LAB — POCT INR: INR: 2.3

## 2015-01-03 ENCOUNTER — Ambulatory Visit (INDEPENDENT_AMBULATORY_CARE_PROVIDER_SITE_OTHER): Payer: Medicare Other | Admitting: *Deleted

## 2015-01-03 DIAGNOSIS — I480 Paroxysmal atrial fibrillation: Secondary | ICD-10-CM

## 2015-01-03 DIAGNOSIS — I4891 Unspecified atrial fibrillation: Secondary | ICD-10-CM | POA: Diagnosis not present

## 2015-01-03 DIAGNOSIS — Z7901 Long term (current) use of anticoagulants: Secondary | ICD-10-CM

## 2015-01-03 DIAGNOSIS — Z5181 Encounter for therapeutic drug level monitoring: Secondary | ICD-10-CM

## 2015-01-03 LAB — POCT INR: INR: 2.9

## 2015-01-12 ENCOUNTER — Telehealth: Payer: Self-pay | Admitting: Cardiovascular Disease

## 2015-01-12 NOTE — Telephone Encounter (Signed)
New Message  Pt c/o medication issue: 1. Name of Medication: Warfrin   4. What is your medication issue? Pt called states that she took too miuch of her coumadin request a call back to determine how to regulate the medication

## 2015-01-12 NOTE — Telephone Encounter (Signed)
Pt states she took coumadin 7.5mg  on Monday night instead of 5mg  and then on Tuesday she took coumadin 5mg  instead of 7.5mg  so essentially this is the same dose just changed days so reassured that she did the right thing and that she should continue on her same dose and keep her scheduled appt and she stated understanding

## 2015-01-31 ENCOUNTER — Ambulatory Visit (INDEPENDENT_AMBULATORY_CARE_PROVIDER_SITE_OTHER): Payer: Medicare Other | Admitting: *Deleted

## 2015-01-31 DIAGNOSIS — Z5181 Encounter for therapeutic drug level monitoring: Secondary | ICD-10-CM | POA: Diagnosis not present

## 2015-01-31 DIAGNOSIS — I4891 Unspecified atrial fibrillation: Secondary | ICD-10-CM

## 2015-01-31 DIAGNOSIS — Z7901 Long term (current) use of anticoagulants: Secondary | ICD-10-CM | POA: Diagnosis not present

## 2015-01-31 DIAGNOSIS — I480 Paroxysmal atrial fibrillation: Secondary | ICD-10-CM

## 2015-01-31 LAB — POCT INR: INR: 2.1

## 2015-02-01 ENCOUNTER — Encounter: Payer: Self-pay | Admitting: Neurology

## 2015-02-25 ENCOUNTER — Ambulatory Visit (INDEPENDENT_AMBULATORY_CARE_PROVIDER_SITE_OTHER): Payer: Medicare Other | Admitting: Neurology

## 2015-02-25 ENCOUNTER — Other Ambulatory Visit (INDEPENDENT_AMBULATORY_CARE_PROVIDER_SITE_OTHER): Payer: Medicare Other

## 2015-02-25 ENCOUNTER — Encounter: Payer: Self-pay | Admitting: Neurology

## 2015-02-25 ENCOUNTER — Telehealth: Payer: Self-pay | Admitting: Cardiovascular Disease

## 2015-02-25 VITALS — BP 140/82 | HR 75 | Wt 120.0 lb

## 2015-02-25 DIAGNOSIS — F0390 Unspecified dementia without behavioral disturbance: Secondary | ICD-10-CM | POA: Diagnosis not present

## 2015-02-25 DIAGNOSIS — Z853 Personal history of malignant neoplasm of breast: Secondary | ICD-10-CM | POA: Diagnosis not present

## 2015-02-25 DIAGNOSIS — F03A0 Unspecified dementia, mild, without behavioral disturbance, psychotic disturbance, mood disturbance, and anxiety: Secondary | ICD-10-CM | POA: Insufficient documentation

## 2015-02-25 LAB — VITAMIN B12: VITAMIN B 12: 776 pg/mL (ref 211–911)

## 2015-02-25 LAB — BUN: BUN: 18 mg/dL (ref 6–23)

## 2015-02-25 LAB — CREATININE, SERUM: CREATININE: 0.79 mg/dL (ref 0.40–1.20)

## 2015-02-25 MED ORDER — DONEPEZIL HCL 10 MG PO TABS: ORAL_TABLET | ORAL | Status: DC

## 2015-02-25 NOTE — Progress Notes (Signed)
NEUROLOGY CONSULTATION NOTE  Candice Schultz MRN: [id number] DOB: 17-Apr-1934  Referring provider: Dr. Lavone Orn Primary care provider: Dr. Lavone Orn  Reason for consult:  Memory loss  Dear Dr Laurann Montana:  Thank you for your kind referral of Candice Schultz for consultation of the above symptoms. Although her history is well known to you, please allow me to reiterate it for the purpose of our medical record. The patient was accompanied to the clinic by her daughter who also provides collateral information. Records and images were personally reviewed where available.  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 She reports her memory is "sometimes excellent, sometimes not so good." She denies missing medications or missed bill payments, but her daughter states her husband had mentioned she paid a bill twice. Her family started noticing memory changes around 2 years ago. Her daughter has a list of several concerning events. Last summer, she saw her parents prepare their pillboxes, and it took her mother 1-1/2 hours to put her medications in the weekly containing, repeatedly checking and re-checking. She has seen a couple of situations with money, one time she saw her mother in the nail salon being assisted by staff with counting money. Another time they were at the laboratory for blood draw with the front desk asking for payment, she kept looking at her card and staff had to repeat amount. Her daughter has noticed more difficulties with processing information. Last summer, she had written the wrong amount on a check and another time gave her the incorrect amount for her share in a gift they bought together. She is a good cook and good with recipes, but family has seen this dwindle, they are seeing  signs that it is more difficult for her and she takes a longer time to cook things. She gets easily confused when on the phone. She used to use her laptop with emails and do puzzles in the newspaper, but the computer has become hard and very confusing for her and she does not attempt to do it anymore. Maintaining a calendar is a challenge, she puts the appointment in a different month. She would pick up someone's purse when she is at her daughter's house thinking it was her, which was not even similar to the one she brought in. Friends have noticed this as well and asked family what is going on. One time she saw friends at the store, and they felt they had to follow her to make sure she got back to her husband fine. Her daughter has also noticed she is not as communicative, more quiet, with difficulty with her conversation skills. She would stop in the middle of a sentence and get lost, having to think of what she was saying or having difficulty finding the words. She denies getting lost driving, but her daughter reports one time where her husband had to get her and follow her home.   She has occasional neck pain and constipation. She has noticed hand tremors in the past year. Her maternal grandmother had tremors. There is no family history of dementia, although her grandmother had memory problems and was diagnosed with a brain tumor. She denies any significant head injuries or alcohol intake. She denies any headaches, dizziness, vision changes, dysarthria/dysphagia, back pain, bladder dysfunction, anosmia.   Laboratory Data: Lab  Results  Component Value Date   TSH 1.66 09/10/2013     PAST MEDICAL HISTORY: Past Medical History  Diagnosis Date  . AF (atrial fibrillation) ([place])   . Chest pain   . Hyperlipidemia   . Cancer (HCC)     breast  . Heart palpitations     related for flecainide and due to having afib   . Constipation     PAST SURGICAL HISTORY: Past Surgical History  Procedure  Laterality Date  . Cardiac catheterization  08/2008    NORMAL CORONARY ARTERIES  . Abdominal hysterectomy    . Breast biopsy    . Appendectomy      MEDICATIONS: Current Outpatient Prescriptions on File Prior to Visit  Medication Sig Dispense Refill  . Calcium Citrate-Vitamin D (CALCIUM CITRATE + D PO) Take 1 tablet by mouth 2 (two) times daily.    . digoxin (DIGOX) 0.125 MG tablet Take 1 tablet (125 mcg total) by mouth daily. 90 tablet 0  . docusate sodium (COLACE) 100 MG capsule Take 100 mg by mouth daily as needed.     . flecainide (TAMBOCOR) 100 MG tablet Take 1 tablet (100 mg total) by mouth 2 (two) times daily. 180 tablet 3  . Multiple Vitamin (MULTIVITAMIN) tablet Take 1 tablet by mouth daily.      Marland Kitchen warfarin (COUMADIN) 5 MG tablet Take 1 tablet (5 mg total) by mouth as directed. 135 tablet 0   No current facility-administered medications on file prior to visit.    ALLERGIES: Allergies  Allergen Reactions  . Meperidine     Other reaction(s): nausea  . Midazolam     Other reaction(s): nausea  . Pravastatin Other (See Comments)    Memory Issues  . Propafenone Hcl Er   . Sulfa Antibiotics     Other reaction(s): nausea   . Penicillins Rash    FAMILY HISTORY: Family History  Problem Relation Age of Onset  . Cancer Mother     Brain  . Cancer Sister     Breast  . Cancer - Other Brother     SOCIAL HISTORY: Social History   Social History  . Marital Status: Married    Spouse Name: N/A  . Number of Children: 2  . Years of Education: N/A   Occupational History  . Retired    Social History Main Topics  . Smoking status: Former Smoker    Quit date: 02/27/1983  . Smokeless tobacco: Never Used  . Alcohol Use: No  . Drug Use: No  . Sexual Activity: Not on file   Other Topics Concern  . Not on file   Social History Narrative    REVIEW OF SYSTEMS: Constitutional: No fevers, chills, or sweats, no generalized fatigue, change in appetite Eyes: No visual  changes, double vision, eye pain Ear, nose and throat: No hearing loss, ear pain, nasal congestion, sore throat Cardiovascular: No chest pain, palpitations Respiratory:  No shortness of breath at rest or with exertion, wheezes GastrointestinaI: No nausea, vomiting, diarrhea, abdominal pain, fecal incontinence Genitourinary:  No dysuria, urinary retention or frequency Musculoskeletal:  + neck pain, no back pain Integumentary: No rash, pruritus, skin lesions Neurological: as above Psychiatric: No depression, insomnia, anxiety Endocrine: No palpitations, fatigue, diaphoresis, mood swings, change in appetite, change in weight, increased thirst Hematologic/Lymphatic:  No anemia, purpura, petechiae. Allergic/Immunologic: no itchy/runny eyes, nasal congestion, recent allergic reactions, rashes  PHYSICAL EXAM: Filed Vitals:   02/25/15 1223  BP: 140/82  Pulse: 75   General: No  acute distress, flat affect Head:  Normocephalic/atraumatic Eyes: Fundoscopic exam shows bilateral sharp discs, no vessel changes, exudates, or hemorrhages Neck: supple, no paraspinal tenderness, full range of motion Back: No paraspinal tenderness Heart: regular rate and rhythm Lungs: Clear to auscultation bilaterally. Vascular: No carotid bruits. Skin/Extremities: No rash, no edema Neurological Exam: Mental status: alert and oriented to person, place, and time, no dysarthria or aphasia, Fund of knowledge is appropriate.  Remote memory intact.  Attention and concentration are reduced. Able to name objects and repeat phrases. She had some difficulty with processing information given for [facility] testing, but also reported feeling worked up. Difficulty with clock drawing. Montreal Cognitive Assessment  02/25/2015  Visuospatial/ Executive (0/5) 2  Naming (0/3) 1  Attention: Read list of digits (0/2) 1  Attention: Read list of letters (0/1) 0  Attention: Serial 7 subtraction starting at 100 (0/3) 2  Language: Repeat phrase  (0/2) 2  Language : Fluency (0/1) 0  Abstraction (0/2) 2  Delayed Recall (0/5) 1  Orientation (0/6) 6  Total 17  Adjusted Score (based on education) 17   Cranial nerves: CN I: not tested CN II: pupils equal, round and reactive to light, visual fields intact, fundi unremarkable. CN III, IV, VI:  full range of motion, no nystagmus, no ptosis CN V: facial sensation intact CN VII: upper and lower face symmetric CN VIII: hearing intact to finger rub CN IX, X: gag intact, uvula midline CN XI: sternocleidomastoid and trapezius muscles intact CN XII: tongue midline Bulk & Tone: normal, no cogwheeling, no fasciculations. Motor: 5/5 throughout with no pronator drift. Sensation: intact to light touch, cold, pin, vibration and joint position sense.  No extinction to double simultaneous stimulation.  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 Her neurological exam is non-focal, MOCA score 17/30 indicating mild dementia. We discussed different causes of memory loss. Check B12. MRI brain with and without contrast will be ordered to assess for underlying structural abnormality particularly with history of breast cancer, as well as assess vascular load. We discussed that she may benefit from starting cholinesterase inhibitors such as Aricept, side effects and expectations from the medication were discussed. She was given a prescription for Aricept 5mg  daily for 2 weeks, then increase to 10mg  daily. We discussed the importance of control of  vascular risk factors, physical exercise, and brain stimulation exercises for brain health. We also discussed driving concerns and having a driving assessment through the [facility]. We discussed home safety and long-term planning for potentially getting more help at home in the future. She will follow-up in 6 months and knows to call for any changes.   Thank you for allowing me to participate in the care of this patient. Please do not hesitate to call for any questions or concerns.   Ellouise Newer, M.D.  CC: Dr. Laurann Montana

## 2015-02-25 NOTE — Patient Instructions (Addendum)
1. Schedule MRI brain with and without contrast 2. Bloodwork for B12, BUN, creatinine 3. Start Aricept 10mg : Take 1/2 tablet daily for 2 weeks, then increase to 1 tablet daily 4. Recommend getting a driving assessment for dementia through the [employer] 5. Physical exercise and brain stimulation exercises are important for brain health 6. Follow-up in 6 months, call for any changes

## 2015-02-25 NOTE — Telephone Encounter (Signed)
Left message for daughter, Wende Neighbors, to call back

## 2015-02-25 NOTE — Telephone Encounter (Signed)
Ne wMessage  Pt dtr calling to speak w/ rn concerning recent med prescribed by Dr Delice Lesch. Please call back and discuss.

## 2015-02-26 ENCOUNTER — Emergency Department (HOSPITAL_COMMUNITY)
Admission: EM | Admit: 2015-02-26 | Discharge: 2015-02-26 | Disposition: A | Discharge status: Home / Self Care | Payer: Medicare Other | Attending: Emergency Medicine | Admitting: Emergency Medicine

## 2015-02-26 ENCOUNTER — Emergency Department (HOSPITAL_COMMUNITY): Payer: Medicare Other

## 2015-02-26 ENCOUNTER — Encounter (HOSPITAL_COMMUNITY): Payer: Self-pay | Admitting: Emergency Medicine

## 2015-02-26 DIAGNOSIS — Z90710 Acquired absence of both cervix and uterus: Secondary | ICD-10-CM | POA: Insufficient documentation

## 2015-02-26 DIAGNOSIS — R519 Headache, unspecified: Secondary | ICD-10-CM

## 2015-02-26 DIAGNOSIS — Z87891 Personal history of nicotine dependence: Secondary | ICD-10-CM | POA: Insufficient documentation

## 2015-02-26 DIAGNOSIS — Z853 Personal history of malignant neoplasm of breast: Secondary | ICD-10-CM | POA: Diagnosis not present

## 2015-02-26 DIAGNOSIS — Z8639 Personal history of other endocrine, nutritional and metabolic disease: Secondary | ICD-10-CM | POA: Diagnosis not present

## 2015-02-26 DIAGNOSIS — Z9889 Other specified postprocedural states: Secondary | ICD-10-CM | POA: Diagnosis not present

## 2015-02-26 DIAGNOSIS — F0390 Unspecified dementia without behavioral disturbance: Secondary | ICD-10-CM | POA: Insufficient documentation

## 2015-02-26 DIAGNOSIS — I4891 Unspecified atrial fibrillation: Secondary | ICD-10-CM | POA: Diagnosis not present

## 2015-02-26 DIAGNOSIS — R51 Headache: Secondary | ICD-10-CM | POA: Insufficient documentation

## 2015-02-26 DIAGNOSIS — Z880 Allergy status to penicillin: Secondary | ICD-10-CM | POA: Insufficient documentation

## 2015-02-26 DIAGNOSIS — R102 Pelvic and perineal pain: Secondary | ICD-10-CM | POA: Insufficient documentation

## 2015-02-26 DIAGNOSIS — Z7901 Long term (current) use of anticoagulants: Secondary | ICD-10-CM | POA: Insufficient documentation

## 2015-02-26 DIAGNOSIS — Z79899 Other long term (current) drug therapy: Secondary | ICD-10-CM | POA: Diagnosis not present

## 2015-02-26 DIAGNOSIS — R079 Chest pain, unspecified: Secondary | ICD-10-CM | POA: Diagnosis not present

## 2015-02-26 LAB — CBC
HCT: 37 % (ref 36.0–46.0)
HEMOGLOBIN: 12.6 g/dL (ref 12.0–15.0)
MCH: 30.9 pg (ref 26.0–34.0)
MCHC: 34.1 g/dL (ref 30.0–36.0)
MCV: 90.7 fL (ref 78.0–100.0)
Platelets: 333 10*3/uL (ref 150–400)
RBC: 4.08 MIL/uL (ref 3.87–5.11)
RDW: 12.2 % (ref 11.5–15.5)
WBC: 3.8 10*3/uL — ABNORMAL LOW (ref 4.0–10.5)

## 2015-02-26 LAB — PROTIME-INR
INR: 1.97 — AB (ref 0.00–1.49)
Prothrombin Time: 22.3 seconds — ABNORMAL HIGH (ref 11.6–15.2)

## 2015-02-26 LAB — BASIC METABOLIC PANEL
ANION GAP: 9 (ref 5–15)
BUN: 19 mg/dL (ref 6–20)
CALCIUM: 8.8 mg/dL — AB (ref 8.9–10.3)
CO2: 26 mmol/L (ref 22–32)
Chloride: 93 mmol/L — ABNORMAL LOW (ref 101–111)
Creatinine, Ser: 0.68 mg/dL (ref 0.44–1.00)
GFR calc Af Amer: >60 mL/min (ref >60)
GFR calc non Af Amer: >60 mL/min (ref >60)
GLUCOSE: 114 mg/dL — AB (ref 65–99)
Potassium: 3.8 mmol/L (ref 3.5–5.1)
Sodium: 128 mmol/L — ABNORMAL LOW (ref 135–145)

## 2015-02-26 LAB — I-STAT TROPONIN, ED: TROPONIN I, POC: 0 ng/mL (ref 0.00–0.08)

## 2015-02-26 LAB — DIGOXIN LEVEL: DIGOXIN LVL: 0.9 ng/mL (ref 0.8–2.0)

## 2015-02-26 MED ORDER — ONDANSETRON 8 MG PO TBDP
8.0000 mg | ORAL_TABLET | Freq: Once | ORAL | Status: AC
Start: 2015-02-26 — End: 2015-02-26
  Administered 2015-02-26: 8 mg via ORAL
  Filled 2015-02-26: qty 1

## 2015-02-26 NOTE — ED Provider Notes (Signed)
CSN: [id number]     Arrival date & time 02/26/15  1701 History   First MD Initiated Contact with Patient 02/26/15 1758     Chief Complaint  Patient presents with  . Chest Pain  . Headache     (Consider location/radiation/quality/duration/timing/severity/associated sxs/prior Treatment) HPI Comments: The patient is a 80 year old female, she has a history of recently diagnosed dementia, she also has a history of atrial fibrillation on digoxin as well as flecainide and Coumadin. She presents to the hospital today the same day she started taking Aricept, she states that she started to have chest pain abdominal pain pelvic pain and a headache, all of these things have resolved except for the headache. She has had no trouble with speaking, vision, balance but does state that the headache was pretty severe. She does not know her most recent INR. At this time her symptoms are improving, she has mild nausea with it. She did take Tylenol 3 hours ago.  Patient is a 80 y.o. female presenting with chest pain and headaches. The history is provided by the patient.  Chest Pain Associated symptoms: headache   Headache   Past Medical History  Diagnosis Date  . AF (atrial fibrillation) ([place])   . Chest pain   . Hyperlipidemia   . Cancer (HCC)     breast  . Heart palpitations     related for flecainide and due to having afib   . Constipation    Past Surgical History  Procedure Laterality Date  . Cardiac catheterization  08/2008    NORMAL CORONARY ARTERIES  . Abdominal hysterectomy    . Breast biopsy    . Appendectomy     Family History  Problem Relation Age of Onset  . Cancer Mother     Brain  . Cancer Sister     Breast  . Cancer - Other Brother    Social History  Substance Use Topics  . Smoking status: Former Smoker    Quit date: 02/27/1983  . Smokeless tobacco: Never Used  . Alcohol Use: No   OB History    No data available     Review of Systems  Cardiovascular: Positive for  chest pain.  Neurological: Positive for headaches.  All other systems reviewed and are negative.     Allergies  Meperidine; Midazolam; Pravastatin; Propafenone hcl er; Sulfa antibiotics; and Penicillins  Home Medications   Prior to Admission medications   Medication Sig Start Date End Date Taking? Authorizing Provider  Calcium Citrate-Vitamin D (CALCIUM CITRATE + D PO) Take 1 tablet by mouth 2 (two) times daily.   Yes Historical Provider, MD  digoxin (DIGOX) 0.125 MG tablet Take 1 tablet (125 mcg total) by mouth daily. 04/19/14  Yes Thayer Headings, MD  donepezil (ARICEPT) 10 MG tablet Take 1/2 tablet daily for 2 weeks, then increase to 1 tablet daily 02/25/15  Yes Cameron Sprang, MD  flecainide (TAMBOCOR) 100 MG tablet Take 1 tablet (100 mg total) by mouth 2 (two) times daily. 10/12/14  Yes Thayer Headings, MD  LORazepam (ATIVAN) 0.5 MG tablet 0.5 mg. Take 1/2 tablet by mouth twice daily as needed 12/22/12  Yes Historical Provider, MD  Multiple Vitamin (MULTIVITAMIN) tablet Take 1 tablet by mouth every evening.    Yes Historical Provider, MD  warfarin (COUMADIN) 5 MG tablet Take 1 tablet (5 mg total) by mouth as directed. Patient taking differently: Take 5 mg by mouth as directed. Take 5 mg once daily on Monday and Friday.  Take 7.5 mg once daily all other days of the week: Sunday, Tuesday, Wednesday, Thursday, and Saturday. 11/29/14  Yes Thayer Headings, MD   BP 164/78 mmHg  Pulse 65  Temp(Src) 97.8 F (36.6 C) (Oral)  Resp 14  SpO2 99% Physical Exam  Constitutional: She appears well-developed and well-nourished. No distress.  HENT:  Head: Normocephalic and atraumatic.  Mouth/Throat: Oropharynx is clear and moist. No oropharyngeal exudate.  Eyes: Conjunctivae and EOM are normal. Pupils are equal, round, and reactive to light. Right eye exhibits no discharge. Left eye exhibits no discharge. No scleral icterus.  Neck: Normal range of motion. Neck supple. No JVD present. No thyromegaly  present.  Cardiovascular: Normal rate, regular rhythm, normal heart sounds and intact distal pulses.  Exam reveals no gallop and no friction rub.   No murmur heard. Pulmonary/Chest: Effort normal and breath sounds normal. No respiratory distress. She has no wheezes. She has no rales.  Abdominal: Soft. Bowel sounds are normal. She exhibits no distension and no mass. There is no tenderness.  Musculoskeletal: Normal range of motion. She exhibits no edema or tenderness.  Lymphadenopathy:    She has no cervical adenopathy.  Neurological: She is alert. Coordination normal.  Speech is clear, cranial nerves III through XII are intact, memory is intact, strength is normal in all 4 extremities including grips, sensation is intact to light touch and pinprick in all 4 extremities. Coordination as tested by finger-nose-finger is normal, no limb ataxia. Normal gait, normal reflexes at the patellar tendons bilaterally  Skin: Skin is warm and dry. No rash noted. No erythema.  Psychiatric: She has a normal mood and affect. Her behavior is normal.  Nursing note and vitals reviewed.   ED Course  Procedures (including critical care time) Labs Review Labs Reviewed  BASIC METABOLIC PANEL - Abnormal; Notable for the following:    Sodium 128 (*)    Chloride 93 (*)    Glucose, Bld 114 (*)    Calcium 8.8 (*)    All other components within normal limits  CBC - Abnormal; Notable for the following:    WBC 3.8 (*)    All other components within normal limits  PROTIME-INR - Abnormal; Notable for the following:    Prothrombin Time 22.3 (*)    INR 1.97 (*)    All other components within normal limits  DIGOXIN LEVEL  I-STAT TROPOININ, ED    Imaging Review Dg Chest 2 View  02/26/2015  CLINICAL DATA:  Patient with headache and chest pain. EXAM: CHEST  2 VIEW COMPARISON:  Chest radiograph 09/08/2008 FINDINGS: Stable cardiac and mediastinal contours. The lungs are hyperinflated. No large area pulmonary  consolidation. No pleural effusion or pneumothorax. Mid thoracic spine degenerative changes. IMPRESSION: No active cardiopulmonary disease. Electronically Signed   By: Lovey Newcomer M.D.   On: 02/26/2015 18:10   Ct Head Wo Contrast  02/26/2015  CLINICAL DATA:  80 year old female with acute headache today. EXAM: CT HEAD WITHOUT CONTRAST TECHNIQUE: Contiguous axial images were obtained from the base of the skull through the vertex without intravenous contrast. COMPARISON:  02/12/2014 FINDINGS: No intracranial abnormalities are identified, including mass lesion or mass effect, hydrocephalus, extra-axial fluid collection, midline shift, hemorrhage, or acute infarction. The visualized bony calvarium is unremarkable. IMPRESSION: Unremarkable noncontrast head CT. Electronically Signed   By: Margarette Canada M.D.   On: 02/26/2015 18:59   I have personally reviewed and evaluated these images and lab results as part of my medical decision-making.   EKG  Interpretation   Date/Time:  Saturday February 26 2015 17:09:41 EST Ventricular Rate:  73 PR Interval:  208 QRS Duration: 91 QT Interval:  353 QTC Calculation: 389 R Axis:   61 Text Interpretation:  Sinus rhythm Abnormal T, consider ischemia, diffuse  leads since last tracing no significant change Confirmed by Tamari Busic  MD,  Rozalynn Buege ([id number]) on 02/26/2015 5:14:29 PM      MDM   Final diagnoses:  Headache   R/o SAH,  CXR unchanged - NSR present c/w prior - dig effect seen.   CT brain zofran for nausea  Filed Vitals:   02/26/15 1711 02/26/15 1843 02/26/15 2004 02/26/15 2138  BP: 181/72 187/78 187/64 164/78  Pulse: 70 75 65 65  Temp: 97.8 F (36.6 C)     TempSrc: Oral     Resp: 18 18 18 14   SpO2: 100% 100% 100% 99%     Meds given in ED:  Medications  ondansetron (ZOFRAN-ODT) disintegrating tablet 8 mg (8 mg Oral Given 02/26/15 1841)    Discharge Medication List as of 02/26/2015  9:24 PM        Noemi Chapel, MD 02/26/15 2226

## 2015-02-26 NOTE — ED Notes (Signed)
Miller aware of pt complaint and current status.

## 2015-02-26 NOTE — Discharge Instructions (Signed)
Tylenol for headache Return to the ER if the symptoms continue or worsen

## 2015-02-26 NOTE — ED Notes (Signed)
Pt complaint of headache and chest pain onset 1500 post taking donepezil (new prescription for mild dementia). Neuro intact/unremarkable. Given 1 gram of tylenol at home at 1500; denies chest pain now but reports 6/10 headache.

## 2015-03-01 ENCOUNTER — Telehealth: Payer: Self-pay | Admitting: Cardiovascular Disease

## 2015-03-01 ENCOUNTER — Telehealth: Payer: Self-pay | Admitting: *Deleted

## 2015-03-01 NOTE — Telephone Encounter (Signed)
Would not restart medication. Let's wait for another 2 weeks and see how she is doing, once feeling better, we can try a different medication, it comes in a patch so potentially does not have the stomach symptoms, but someone will need to make sure her mother replaces the patch daily. If that is doable for them, we can do this in 2 weeks. Thanks

## 2015-03-01 NOTE — Telephone Encounter (Signed)
Spoke with patient's daughter, [name], who states she wanted to keep Dr. Acie Fredrickson informed of patient's recent diagnosis of mild dementia made by Dr. Delice Lesch last Friday 12/30.  She states she would like to be present at future office visits with Dr. Acie Fredrickson.  States patient went to hospital after 1st dose of aricept with c/o headache; was advised not to take further doses for 2 weeks.  Beverly states patient's BP during hospital visit was higher than she has ever seen, states systolic was [id number] to [id number] mmHg, diastolic was 0000000 to [id number] mmHg.  I advised her to keep a log at home and to call our office if BP remains elevated. She verbalized understanding and agreement.  I thanked her for the update.

## 2015-03-01 NOTE — Telephone Encounter (Signed)
Patient's daughter called asking what to do about her mother's medication.  Patient started having nausea, itching and headache after taking her first dose of generic aricept.  She was instructed to go to the ER by the on call triage nurse which she did.  Her daughter said that her BP was elevated during her visit to the ER.  Should she try to take the aricept again?  Please advise.

## 2015-03-01 NOTE — Telephone Encounter (Signed)
Agree with note by Michelle Swinyer, RN  

## 2015-03-01 NOTE — Telephone Encounter (Signed)
I spoke with patient's daughter/Beverly and notified her of advisement. She states that she will call back in 2 weeks for new medication start. She also mentioned that patient has had some nausea and and she is still dealing with this. She states that they did give patient zofran in the er. I did advise her to call back if nausea doesn't subside and we could possibly do a small Rx for nausea medication to see if that helps her feel better.

## 2015-03-01 NOTE — Telephone Encounter (Signed)
New message    Daughter [name] returning call back to you.

## 2015-03-03 ENCOUNTER — Encounter: Payer: Self-pay | Admitting: Cardiovascular Disease

## 2015-03-04 ENCOUNTER — Telehealth: Payer: Self-pay | Admitting: Family Medicine

## 2015-03-04 ENCOUNTER — Ambulatory Visit
Admission: RE | Admit: 2015-03-04 | Discharge: 2015-03-04 | Disposition: A | Discharge status: Home / Self Care | Payer: Medicare Other | Source: Ambulatory Visit | Attending: Internal Medicine | Admitting: Internal Medicine

## 2015-03-04 DIAGNOSIS — R911 Solitary pulmonary nodule: Secondary | ICD-10-CM

## 2015-03-04 NOTE — Telephone Encounter (Signed)
Left msg to rtn my call.

## 2015-03-04 NOTE — Telephone Encounter (Signed)
-----   Message from Cameron Sprang, MD sent at 03/02/2015  8:59 AM EST ----- Pls let pt know B12 level is normal, thanks

## 2015-03-07 NOTE — Telephone Encounter (Signed)
Notified patient's daughter/Beverly of results.

## 2015-03-08 ENCOUNTER — Ambulatory Visit (HOSPITAL_COMMUNITY)
Admission: RE | Admit: 2015-03-08 | Discharge: 2015-03-08 | Disposition: A | Discharge status: Home / Self Care | Payer: Medicare Other | Source: Ambulatory Visit | Attending: Neurology | Admitting: Neurology

## 2015-03-08 DIAGNOSIS — F0390 Unspecified dementia without behavioral disturbance: Secondary | ICD-10-CM | POA: Insufficient documentation

## 2015-03-08 DIAGNOSIS — Z853 Personal history of malignant neoplasm of breast: Secondary | ICD-10-CM | POA: Diagnosis not present

## 2015-03-08 MED ORDER — GADOBENATE DIMEGLUMINE 529 MG/ML IV SOLN
10.0000 mL | Freq: Once | INTRAVENOUS | Status: AC | PRN
  Administered 2015-03-08: 10 mL via INTRAVENOUS

## 2015-03-10 ENCOUNTER — Telehealth: Payer: Self-pay | Admitting: Neurology

## 2015-03-10 NOTE — Telephone Encounter (Signed)
I spoke with patient's daughter/Beverly and notified her of results.

## 2015-03-10 NOTE — Telephone Encounter (Signed)
Pt daughter wants to see if the MRI results are back please call Between Daughter

## 2015-03-10 NOTE — Telephone Encounter (Signed)
-----   Message from Cameron Sprang, MD sent at 03/09/2015  3:43 PM EST ----- Pls let her know MRI brain unremarkable, no evidence of tumor, stroke, or bleed. It shows age-related changes, thanks

## 2015-03-10 NOTE — Telephone Encounter (Signed)
Returned call to [place]. She states that she has spoken with her sister about patient's MRI results, and our conversation earlier. Which at that time I explained to her per Dr. Delice Lesch that most likely patient has alzheimer's. She states that they decided to tell patient that her MRI result was unremarkable with age related changes, which that is what is showed. But they are going to save the possible alzheimer's dx for when patient comes back in for her f/u to discuss with Dr. Delice Lesch.

## 2015-03-10 NOTE — Telephone Encounter (Signed)
Pt daughter Wende Neighbors would like to speak to you again please call [phone]

## 2015-03-11 ENCOUNTER — Ambulatory Visit: Payer: Medicare Other | Admitting: Neurology

## 2015-03-11 ENCOUNTER — Other Ambulatory Visit: Payer: Self-pay | Admitting: Cardiovascular Disease

## 2015-03-14 ENCOUNTER — Ambulatory Visit (INDEPENDENT_AMBULATORY_CARE_PROVIDER_SITE_OTHER): Payer: Medicare Other | Admitting: Pharmacist

## 2015-03-14 DIAGNOSIS — I480 Paroxysmal atrial fibrillation: Secondary | ICD-10-CM

## 2015-03-14 DIAGNOSIS — Z5181 Encounter for therapeutic drug level monitoring: Secondary | ICD-10-CM

## 2015-03-14 DIAGNOSIS — I4891 Unspecified atrial fibrillation: Secondary | ICD-10-CM | POA: Diagnosis not present

## 2015-03-14 DIAGNOSIS — Z7901 Long term (current) use of anticoagulants: Secondary | ICD-10-CM

## 2015-03-14 LAB — POCT INR: INR: 2.6

## 2015-03-21 ENCOUNTER — Ambulatory Visit (INDEPENDENT_AMBULATORY_CARE_PROVIDER_SITE_OTHER): Payer: Medicare Other | Admitting: Neurology

## 2015-03-21 ENCOUNTER — Encounter: Payer: Self-pay | Admitting: Neurology

## 2015-03-21 VITALS — BP 158/82 | HR 77 | Resp 16 | Wt 119.0 lb

## 2015-03-21 DIAGNOSIS — F03A0 Unspecified dementia, mild, without behavioral disturbance, psychotic disturbance, mood disturbance, and anxiety: Secondary | ICD-10-CM

## 2015-03-21 DIAGNOSIS — F0390 Unspecified dementia without behavioral disturbance: Secondary | ICD-10-CM

## 2015-03-21 DIAGNOSIS — Z0279 Encounter for issue of other medical certificate: Secondary | ICD-10-CM

## 2015-03-21 MED ORDER — RIVASTIGMINE 4.6 MG/24HR TD PT24: 4.6000 mg | MEDICATED_PATCH | Freq: Every day | TRANSDERMAL | Status: DC

## 2015-03-21 NOTE — Progress Notes (Signed)
NEUROLOGY FOLLOW UP OFFICE NOTE  Candice Schultz YE:[id number]  HISTORY OF PRESENT ILLNESS: I had the pleasure of seeing Candice Schultz in follow-up in the neurology clinic on 03/21/2015.  The patient was last seen a month ago for worsening memory, MOCA score was 17/30, indicating mild dementia. She is accompanied by her husband and daughter Candice Schultz who help supplement the history today. Her other daughter Candice Schultz was on speaker phone. Records and images were personally reviewed where available. B12 level normal 776.  I personally reviewed MRI brain with and without contrast which did not show any acute changes. There was mild diffuse atrophy, minimal chronic microvascular disease. She did not tolerate Aricept, on the day she took it she started having pain from her head down her mid-thighs. Her BP was elevated at 187/89, and she was feeling dizzy and nauseated. She went to the ER and was given IV Zofran. Symptoms resolved after 2 days. She denied having an upset stomach. She is feeling better and does not want to restart Aricept. She currently denies any headaches, dizziness, vision changes, dysarthria/dysphagia, back pain, bladder dysfunction, anosmia.   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 She reports her memory is "sometimes excellent, sometimes not so good." She denies missing medications or missed bill payments, but her daughter states her husband had mentioned she paid a bill twice. Her family started noticing memory changes around 2 years ago. Her daughter has a list of several concerning events. Last summer, she saw her parents prepare their pillboxes, and it took her mother 1-1/2 hours to put her medications in the weekly containing, repeatedly checking and re-checking. She has seen a couple of situations with money, one time she saw her mother in the nail  salon being assisted by staff with counting money. Another time they were at the laboratory for blood draw with the front desk asking for payment, she kept looking at her card and staff had to repeat amount. Her daughter has noticed more difficulties with processing information. Last summer, she had written the wrong amount on a check and another time gave her the incorrect amount for her share in a gift they bought together. She is a good cook and good with recipes, but family has seen this dwindle, they are seeing signs that it is more difficult for her and she takes a longer time to cook things. She gets easily confused when on the phone. She used to use her laptop with emails and do puzzles in the newspaper, but the computer has become hard and very confusing for her and she does not attempt to do it anymore. Maintaining a calendar is a challenge, she puts the appointment in a different month. She would pick up someone's purse when she is at her daughter's house thinking it was her, which was not even similar to the one she brought in. Friends have noticed this as well and asked family what is going on. One time she saw friends at the store, and they felt they had to follow her to make sure she got back to her husband fine. Her daughter has also noticed she is not as communicative, more quiet, with difficulty with her conversation skills. She would stop in the middle of a sentence and get lost, having to think of what she was saying or having difficulty finding the words. She denies  getting lost driving, but her daughter reports one time where her husband had to get her and follow her home. There is no family history of dementia, although her grandmother had memory problems and was diagnosed with a brain tumor. She denies any significant head injuries or alcohol intake.   PAST MEDICAL HISTORY: Past Medical History  Diagnosis Date  . AF (atrial fibrillation) ([place])   . Chest pain   . Hyperlipidemia   .  Cancer ([facility])     breast  . Heart palpitations     related for flecainide and due to having afib   . Constipation     MEDICATIONS: Current Outpatient Prescriptions on File Prior to Visit  Medication Sig Dispense Refill  . Calcium Citrate-Vitamin D (CALCIUM CITRATE + D PO) Take 1 tablet by mouth 2 (two) times daily.    . digoxin (DIGOX) 0.125 MG tablet Take 1 tablet (125 mcg total) by mouth daily. 90 tablet 0  . flecainide (TAMBOCOR) 100 MG tablet Take 1 tablet (100 mg total) by mouth 2 (two) times daily. 180 tablet 3  . LORazepam (ATIVAN) 0.5 MG tablet 0.5 mg. Take 1/2 tablet by mouth twice daily as needed    . Multiple Vitamin (MULTIVITAMIN) tablet Take 1 tablet by mouth every evening.     . warfarin (COUMADIN) 5 MG tablet TAKE 1 TABLET AS DIRECTED 135 tablet 0   No current facility-administered medications on file prior to visit.    ALLERGIES: Allergies  Allergen Reactions  . Meperidine     Other reaction(s): nausea  . Midazolam     Other reaction(s): nausea  . Pravastatin Other (See Comments)    Memory Issues  . Propafenone Hcl Er   . Sulfa Antibiotics     Other reaction(s): nausea   . Penicillins Rash        FAMILY HISTORY: Family History  Problem Relation Age of Onset  . Cancer Mother     Brain  . Cancer Sister     Breast  . Cancer - Other Brother     SOCIAL HISTORY: Social History   Social History  . Marital Status: Married    Spouse Name: N/A  . Number of Children: 2  . Years of Education: N/A   Occupational History  . Retired    Social History Main Topics  . Smoking status: Former Smoker    Quit date: 02/27/1983  . Smokeless tobacco: Never Used  . Alcohol Use: No  . Drug Use: No  . Sexual Activity: Not on file   Other Topics Concern  . Not on file   Social History Narrative    REVIEW OF SYSTEMS: Constitutional: No fevers, chills, or sweats, no generalized fatigue, change in appetite Eyes: No visual changes, double vision, eye pain Ear,  nose and throat: No hearing loss, ear pain, nasal congestion, sore throat Cardiovascular: No chest pain, palpitations Respiratory:  No shortness of breath at rest or with exertion, wheezes GastrointestinaI: No nausea, vomiting, diarrhea, abdominal pain, fecal incontinence Genitourinary:  No dysuria, urinary retention or frequency Musculoskeletal:  No neck pain, back pain Integumentary: No rash, pruritus, skin lesions Neurological: as above Psychiatric: No depression, insomnia, anxiety Endocrine: No palpitations, fatigue, diaphoresis, mood swings, change in appetite, change in weight, increased thirst Hematologic/Lymphatic:  No anemia, purpura, petechiae. Allergic/Immunologic: no itchy/runny eyes, nasal congestion, recent allergic reactions, rashes  PHYSICAL EXAM: Filed Vitals:   03/21/15 1517  BP: 158/82  Pulse: 77  Resp: 16   General: No acute distress Head:  Normocephalic/atraumatic Skin/Extremities: No rash, no edema Neurological Exam: awake and alert, Attention and concentration are normal.    Cranial nerves: Pupils equal, round. Extraocular movements intact with no nystagmus. Visual fields full. No facial asymmetry. Motor: moves all extremities symmetrically. 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 MOCA score in December 2016 was 17/30 indicating mild dementia. MRI brain unremarkable, with age-related diffuse atrophy. She did not tolerate Aricept. The patient and her family had several questions about her diagnosis, prognosis, and management. I reviewed the MRI brain images with her family and discussed the diagnosis of mild dementia, likely Alzheimer's type. I had an extensive discussion about prognosis, expectations from medications, and lifestyle modifications. She does not want to restart Aricept and was initially hesitant to  start other cholinesterase inhibitors, but agreed to try Exelon patch. Instructions were given to the family to ensure this is replaced daily. We again discussed the importance of control of vascular risk factors, physical exercise, and brain stimulation exercises for brain health. We also again discussed driving concerns and having a driving assessment through the [facility]. DMV forms will be filled out for her. She will follow-up in 8 months and knows to call for any changes.   Thank you for allowing me to participate in her care.  Please do not hesitate to call for any questions or concerns.  The duration of this appointment visit was 25 minutes of face-to-face time with the patient.  Greater than 50% of this time was spent in counseling, explanation of diagnosis, planning of further management, and coordination of care.   Ellouise Newer, M.D.   CC: Dr. Laurann Montana

## 2015-03-21 NOTE — Patient Instructions (Addendum)
1. Start Exelon 4.6mg /24hr patch, replace patch every 24 hours 2. Physical exercise and brain stimulation exercises are important for brain health 3. Follow-up in 8 months, call for any changes 4. The Alzheimer's Association of  is a good resource as well. http://martinez.info/   LIFE WITH DEMENTIA ([place])- Being diagnosed with any form of dementia can be overwhelming both for patients and for their loved ones.  For people with dementia - It is important for people with early dementia to care for their physical and mental health. This means getting regular checkups, taking medicines if needed, eating a healthy diet, exercising regularly, getting enough sleep, and avoiding activities that may be risky.  It is often helpful to talk to others through support groups or a counselor or social worker to discuss feelings of anxiety, frustration, anger, loneliness, or depression. All of these feelings are normal, and dealing with these feelings can help you to feel more in control of your wellbeing.  Another issue to consider is how to tell family and friends about the diagnosis of dementia. Explaining the disease can help others to understand what to expect and how they can help, now and in the future. This can be especially helpful for children and grandchildren, who may not be familiar with the condition.  You may live alone in the early stages of dementia, but you may need help with tasks such as housekeeping, cooking, transportation, and paying bills. If possible, ask a friend or family member for help to develop plans to deal with these and other issues as dementia progresses. Occupational therapists, and sometimes speech pathologists, can help to set up your home to minimize confusion and keep you independent for as long as possible.  In addition, you should discuss your preferences regarding issues that are likely to become important as dementia worsens, including: ?Is health insurance  available? ?Where will I live? ?Who will make healthcare and end of life decisions? ?Who will pay for care?  A number of resources are available to assist in this type of planning. In some communities there are support groups specifically for people with frontotemporal dementia. These may be a better fit than groups for anyone with dementia since people with FTD are often younger and may have more bothersome behavioral problems.  For caregivers - Dementia is debilitating and can impose an enormous burden on patients and families or other caregivers. People with dementia become less able to care for themselves as the condition progresses. Some tips that may help caregivers include the following: ?Make a daily plan and prepare to be flexible if needed. ?Try to be patient when responding to repetitive questions, behaviors, or statements. ?Try not to argue or confront persons with dementia when they express mistaken ideas or facts. Change the subject or gently remind them of an inaccuracy. Arguing or trying to convince a person of "the truth" is a natural reaction but it can be frustrating to all and can trigger unwanted behavior and feelings. ?Use memory aids such as writing out a list of daily activities, phone numbers, and instructions for usual tasks (ie, the telephone, microwave, etc). ?Establish calm nighttime routines to manage behavioral problems, which are often worst at night. Leave a night light on in the person's bedroom. ?Avoid major changes to the home environment. ?Employ safety measures in the home, such as locks on medicine cabinets, keep furniture in the same place to prevent falls, remove electrical appliances from the bathroom, install grab bars in the bathroom, and set the  water heater below 120F. ?Help the patient perform personal care as they are willing and able. It is not necessary to bathe every day, although a healthcare provider should be notified if the person develops sores  in the mouth or genitals related to hygiene problems (eg, urinary leakage, ill-fitting dentures). ?Speak slowly, present only one idea at a time, and be patient when waiting for responses. ?Encourage physical activity and exercise. A daily walk can help prevent physical decline and improve behavioral problems. ?Consider respite care. Respite care can provide a needed break for family and can strengthen the family's ability to provide care in the future. This is offered in many communities in the form of in-home care or adult day care. Caregiving can be an all-consuming experience. Be sure to take time for yourself, take care of your own medical problems, and arrange for breaks when you need them.

## 2015-04-11 ENCOUNTER — Other Ambulatory Visit: Payer: Self-pay | Admitting: Cardiovascular Disease

## 2015-04-11 MED ORDER — DIGOXIN 125 MCG PO TABS
125.0000 ug | ORAL_TABLET | Freq: Every day | ORAL | Status: DC
Start: 2015-04-11 — End: 2015-12-26

## 2015-04-18 ENCOUNTER — Ambulatory Visit (INDEPENDENT_AMBULATORY_CARE_PROVIDER_SITE_OTHER): Payer: Medicare Other | Admitting: *Deleted

## 2015-04-18 ENCOUNTER — Telehealth: Payer: Self-pay

## 2015-04-18 ENCOUNTER — Encounter: Payer: Self-pay | Admitting: Neurology

## 2015-04-18 DIAGNOSIS — I480 Paroxysmal atrial fibrillation: Secondary | ICD-10-CM

## 2015-04-18 DIAGNOSIS — Z7901 Long term (current) use of anticoagulants: Secondary | ICD-10-CM

## 2015-04-18 DIAGNOSIS — Z5181 Encounter for therapeutic drug level monitoring: Secondary | ICD-10-CM

## 2015-04-18 DIAGNOSIS — I4891 Unspecified atrial fibrillation: Secondary | ICD-10-CM | POA: Diagnosis not present

## 2015-04-18 LAB — POCT INR: INR: 2.8

## 2015-04-18 MED ORDER — APIXABAN 5 MG PO TABS: 5.0000 mg | ORAL_TABLET | Freq: Two times a day (BID) | ORAL | Status: DC

## 2015-04-18 NOTE — Telephone Encounter (Signed)
Prior auth for Eliquis 5 mg sent to [facility] 

## 2015-04-18 NOTE — Progress Notes (Signed)
   A full discussion of the nature of anticoagulants has been carried out.  A benefit/risk analysis has been presented to the patient, so that they understand the justification for choosing anticoagulation with Eliquis at this time.  The need for compliance is stressed.  Pt is aware to take the medication twice daily.  Side effects of potential bleeding are discussed, including unusual colored urine or stools, coughing up blood or coffee ground emesis, nose bleeds or serious fall or head trauma.  Discussed signs and symptoms of stroke. The patient should avoid any OTC items containing aspirin or ibuprofen.  Avoid alcohol consumption.   Call if any signs of abnormal bleeding.  Discussed financial obligations and resolved any difficulty in obtaining medication.  Next lab test test in 1 month.

## 2015-04-18 NOTE — Telephone Encounter (Signed)
Eliquis approved through 02/26/2016. [id number].

## 2015-04-20 ENCOUNTER — Telehealth: Payer: Self-pay | Admitting: *Deleted

## 2015-04-20 MED ORDER — APIXABAN 5 MG PO TABS: 5.0000 mg | ORAL_TABLET | Freq: Two times a day (BID) | ORAL | Status: DC

## 2015-04-20 NOTE — Telephone Encounter (Signed)
Had to resend order on Eliquis, Pharmacy needed Dr. Elmarie Shiley sig on the RX.

## 2015-04-20 NOTE — Telephone Encounter (Signed)
Follow up     Calling regarding 2 presc for eliquis they received.  Please call

## 2015-04-20 NOTE — Telephone Encounter (Signed)
wanted to check on what was going on with eliquis, we spoke about stopping the warfarin and starting the eliquis for her per coumadin check.

## 2015-05-11 ENCOUNTER — Telehealth: Payer: Self-pay | Admitting: Neurology

## 2015-05-11 NOTE — Telephone Encounter (Signed)
PT's daughter [name] called in regards to [facility] forms/Dawn CB# [phone]

## 2015-05-11 NOTE — Telephone Encounter (Signed)
Returned call. Spoke with Candice Schultz, she wanted to check when we faxed patients DMV forms to [facility]. She states that they haven't received any correspondence from them. I did tell her the forms were faxed on Feb. 1st. She said they would give it a little more time and she would call the medical division and check on the status. I told her to call me back if she had trouble getting information on status of form processing.

## 2015-05-13 ENCOUNTER — Telehealth: Payer: Self-pay | Admitting: Neurology

## 2015-05-13 NOTE — Telephone Encounter (Signed)
PT's daughter [name] called in regards to an itching reaction/Dawn CB# [phone]

## 2015-05-13 NOTE — Telephone Encounter (Signed)
I spoke with [facility]. She states that patient is having some issues with Exelon patch again. She is having some redness and itching on chest & back of shoulder. Sometimes she places patch on chest, abdomen, or shoulder.

## 2015-05-16 ENCOUNTER — Ambulatory Visit (INDEPENDENT_AMBULATORY_CARE_PROVIDER_SITE_OTHER): Payer: Medicare Other | Admitting: *Deleted

## 2015-05-16 DIAGNOSIS — I4891 Unspecified atrial fibrillation: Secondary | ICD-10-CM

## 2015-05-16 DIAGNOSIS — Z7901 Long term (current) use of anticoagulants: Secondary | ICD-10-CM | POA: Diagnosis not present

## 2015-05-16 LAB — CBC
HEMATOCRIT: 35.4 % — AB (ref 36.0–46.0)
Hemoglobin: 12.1 g/dL (ref 12.0–15.0)
MCH: 31.5 pg (ref 26.0–34.0)
MCHC: 34.2 g/dL (ref 30.0–36.0)
MCV: 92.2 fL (ref 78.0–100.0)
MPV: 8.5 fL — AB (ref 8.6–12.4)
Platelets: 322 10*3/uL (ref 150–400)
RBC: 3.84 MIL/uL — AB (ref 3.87–5.11)
RDW: 12.9 % (ref 11.5–15.5)
WBC: 3.8 10*3/uL — ABNORMAL LOW (ref 4.0–10.5)

## 2015-05-16 LAB — BASIC METABOLIC PANEL
BUN: 17 mg/dL (ref 7–25)
CHLORIDE: 95 mmol/L — AB (ref 98–110)
CO2: 27 mmol/L (ref 20–31)
CREATININE: 0.78 mg/dL (ref 0.60–0.93)
Calcium: 9.6 mg/dL (ref 8.6–10.4)
Glucose, Bld: 84 mg/dL (ref 65–99)
Potassium: 4.2 mmol/L (ref 3.5–5.3)
Sodium: 134 mmol/L — ABNORMAL LOW (ref 135–146)

## 2015-05-16 MED ORDER — RIVASTIGMINE TARTRATE 1.5 MG PO CAPS: 1.5000 mg | ORAL_CAPSULE | Freq: Two times a day (BID) | ORAL | Status: DC

## 2015-05-16 NOTE — Telephone Encounter (Signed)
I spoke with [facility]. They will try Exelon pill to see how she does. I did advise to monitor for diarrhea. Will send Rx to pharmacy. Asked to call us if any further questions or problems.

## 2015-05-16 NOTE — Telephone Encounter (Signed)
With the sensitivity to patch, would recommend doing pill instead. We can start a very low dose Exelon 1.5mg  twice a day and see how she does. Monitor for diarrhea. Thanks

## 2015-05-18 NOTE — Progress Notes (Signed)
Pt was started on Eliquis for Afib on 04/18/15.    Reviewed patients medication list.  Pt is not  currently on any combined P-gp and strong CYP3A4 inhibitors/inducers (ketoconazole, traconazole, ritonavir, carbamazepine, phenytoin, rifampin, St. John's wort).  Reviewed labs.  SCr 0.78, Weight 54.2Kg, Age 8Yrs.  Dose appropriate based on specified criteria.   Hgb and HCT 12.1/35.4.   A full discussion of the nature of anticoagulants has been carried out.  A benefit/risk analysis has been presented to the patient, so that they understand the justification for choosing anticoagulation with Eliquis at this time.  The need for compliance is stressed.  Pt is aware to take the medication twice daily.  Side effects of potential bleeding are discussed, including unusual colored urine or stools, coughing up blood or coffee ground emesis, nose bleeds or serious fall or head trauma.  Discussed signs and symptoms of stroke. The patient should avoid any OTC items containing aspirin or ibuprofen.  Avoid alcohol consumption.   Call if any signs of abnormal bleeding.  Discussed financial obligations and resolved any difficulty in obtaining medication.  Next lab test test in 6 months on 11/16/15, If labs are stable at that time, pt can follow up with Cardiologist as regularly scheduled.

## 2015-05-31 ENCOUNTER — Telehealth: Payer: Self-pay | Admitting: Cardiovascular Disease

## 2015-05-31 NOTE — Telephone Encounter (Signed)
Pt is at low risk for eye surgery  She may hold her Eliquis the day before and the day of surgery  She is at low risk for CV complications

## 2015-05-31 NOTE — Telephone Encounter (Signed)
New message    Request for surgical clearance:  1. What type of surgery is being performed? Eye surgery  -right catract   2. When is this surgery scheduled?  4.11.2017   3. Are there any medications that need to be held prior to surgery and how long? eliquis    4. Name of physician performing surgery? Dr. Shon Hough   5. What is your office phone and fax number? Fax # [phone] /  Phone # [phone]

## 2015-05-31 NOTE — Telephone Encounter (Signed)
I reviewed Dr. Elmarie Shiley advice with patient's daughter, [name], who verbalized understanding and thanked me for the call.

## 2015-06-20 ENCOUNTER — Telehealth: Payer: Self-pay | Admitting: Family Medicine

## 2015-06-20 NOTE — Telephone Encounter (Signed)
Received faxed msg from after hours on call center. [place]/patients daughter called on 06/17/15 @ 3:34 pm. States that mother experienced nausea with the start of Exelon 1.5 mg bid. Patient was recently switched to the pill after the Exelon patch broke her out. States mom only tried pill for 2-3 days until she stopped. I did tell her it would be hard to determine if the nausea would have went away since she was only on med for such a short period of time & she didn't really give her system enough time to get used to the medication. I did advise for her to have patient to start medication again make sure she takes with food and to give it a little longer to see if this subsides after longer use. If patient does still have trouble with  nausea to give [place] a call back.

## 2015-07-28 ENCOUNTER — Encounter: Payer: Self-pay | Admitting: Family Medicine

## 2015-08-26 ENCOUNTER — Ambulatory Visit: Payer: Medicare Other | Admitting: Neurology

## 2015-09-21 ENCOUNTER — Other Ambulatory Visit: Payer: Self-pay | Admitting: Cardiovascular Disease

## 2015-10-24 ENCOUNTER — Encounter: Payer: Self-pay | Admitting: Cardiovascular Disease

## 2015-10-24 ENCOUNTER — Ambulatory Visit (INDEPENDENT_AMBULATORY_CARE_PROVIDER_SITE_OTHER): Payer: Medicare Other | Admitting: Cardiovascular Disease

## 2015-10-24 VITALS — BP 150/90 | HR 69 | Ht 66.0 in | Wt 118.6 lb

## 2015-10-24 DIAGNOSIS — I341 Nonrheumatic mitral (valve) prolapse: Secondary | ICD-10-CM

## 2015-10-24 DIAGNOSIS — I480 Paroxysmal atrial fibrillation: Secondary | ICD-10-CM

## 2015-10-24 MED ORDER — METOPROLOL SUCCINATE ER 25 MG PO TB24: 12.5000 mg | ORAL_TABLET | Freq: Every day | ORAL | 11 refills | Status: DC

## 2015-10-24 NOTE — Progress Notes (Signed)
Candice Schultz Date of Birth  01/17/1935 [facility] / [facility] [id number] [address]     [place],   [address] [phone]  Fax  [phone]  Problem List: 1. Atrial Fibrillation 2. Chest pain - normal coronary arteries 2010 3.  hyperlipidemia 4. Hx of Breast cancer - 1999  History of Present Illness:  Candice Schultz has done well.  Her rhythm has remained stable. She continues to have constipation due to the Flecainide.  She takes propranolol occasionally .  She is exercising on occasion.  09/08/2012  Candice Schultz is doing well.  She has been having some memory loss and she has been holding the pravachol.  Her last LDL at Dr., Jenny Reichmann Delene Ruffini office was 122.  Total chol was 227. Trigs = 151  She remains active.  She has occasional palpitations and takes PRN propranolol.    September 10, 2013:  Candice Schultz is doing OK.  She has white coat HTN .  BP at  Home is always ok.  BP of 110 typically.   Staying in NSR. No CP or dyspnea.  She has bee losing  weight without any explanation   She had an episode CP while in [place] on Oct.   Was taken to [facility] center - cardiac cath looked great.    September 20, 2014:  Has had a good year. No CP or dyspnea  Able to do all of her normal activities without problems .  Is concerned about some weight loss Will be talking to Dr. Laurann Montana about it .  Aug. 28, 2017  Doing well from a cardiac stanpoint  No CP or dyspnea.  Not getting much exercise.  Getting out to [facility].   Current Outpatient Prescriptions on File Prior to Visit  Medication Sig Dispense Refill  . apixaban (ELIQUIS) 5 MG TABS tablet Take 1 tablet (5 mg total) by mouth 2 (two) times daily. 60 tablet 6  . Calcium Citrate-Vitamin D (CALCIUM CITRATE + D PO) Take 1 tablet by mouth 2 (two) times daily.    . digoxin (DIGOX) 0.125 MG tablet Take 1 tablet (125 mcg total) by mouth daily. 90 tablet 2  . flecainide (TAMBOCOR) 100 MG tablet TAKE 1 TABLET(100 MG) BY  MOUTH TWICE DAILY 180 tablet 0  . LORazepam (ATIVAN) 0.5 MG tablet 0.5 mg. Take 1/2 tablet by mouth twice daily as needed    . Multiple Vitamin (MULTIVITAMIN) tablet Take 1 tablet by mouth every evening.     . rivastigmine (EXELON) 1.5 MG capsule Take 1 capsule (1.5 mg total) by mouth 2 (two) times daily. 60 capsule 5   No current facility-administered medications on file prior to visit.     Allergies  Allergen Reactions  . Meperidine     Other reaction(s): nausea  . Midazolam     Other reaction(s): nausea  . Pravastatin Other (See Comments)    Memory Issues  . Propafenone Hcl Er   . Sulfa Antibiotics     Other reaction(s): nausea   . Penicillins Rash    Past Medical History:  Diagnosis Date  . AF (atrial fibrillation) ([place])   . Cancer (HCC)    breast  . Chest pain   . Constipation   . Heart palpitations    related for flecainide and due to having afib   . Hyperlipidemia     Past Surgical History:  Procedure Laterality Date  . ABDOMINAL HYSTERECTOMY    . APPENDECTOMY    . BREAST BIOPSY    .  CARDIAC CATHETERIZATION  08/2008   NORMAL CORONARY ARTERIES    History  Smoking Status  . Former Smoker  . Quit date: 02/27/1983  Smokeless Tobacco  . Never Used    History  Alcohol Use No    Family History  Problem Relation Age of Onset  . Cancer Mother     Brain  . Cancer Sister     Breast  . Cancer - Other Brother     Reviw of Systems:  Reviewed in the HPI.  All other systems are negative.  Physical Exam: BP (!) 150/90 (BP Location: Left Arm, Patient Position: Sitting, Cuff Size: Normal)   Pulse 69   Ht 5\' 6"  (1.676 m)   Wt 118 lb 9.6 oz (53.8 kg)   BMI 19.14 kg/m  The patient is alert and oriented x 3.  The mood and affect are normal.  The skin is warm and dry.  Color is normal.  The HEENT exam reveals that the sclera are nonicteric.  The mucous membranes are moist.  The carotids are 2+ without bruits.  There is no thyromegaly.  There is no JVD.  The lungs  are clear.  The chest wall is non tender.  The heart exam reveals a regular rate with a normal S1 and S2.  She has a A999333 systolic murmur.  The PMI is hyperdynamic today  .   Abdominal exam reveals good bowel sounds.  There is no guarding or rebound.  There is no hepatosplenomegaly or tenderness.  There are no masses.  Exam of the legs reveal no clubbing, cyanosis, or edema.  The legs are without rashes.  The distal pulses are intact.  Cranial nerves II - XII are intact.  Motor and sensory functions are intact.  The gait is normal.  ECG: Aug. 28, 2017:  NSR at 69.  1st degree AV block  LVH with repol  Assessment / Plan:   1. Atrial Fibrillation  - has maintained NSR  2. Chest pain - normal coronary arteries 2010 She's not had any further episodes of chest is covered. Continue current medications.  3.  Hyperlipidemia  4. Hx of Breast cancer - 1999  5. MVP - has a hyperdynamic PMI. Will add Toprol XL 12.5 mg a day    [name], MD  10/25/2015 10:44 AM    [facility] [place],  [place], [place]  [address] Pager [phone] Phone: [phone]; Fax: [phone]   [facility]  [address] [place], [place]  [address] [phone]   Fax [phone]

## 2015-10-24 NOTE — Patient Instructions (Signed)
Medication Instructions:  START: Toprol XL 12.5 mg once daily  Labwork: None ordered  Testing/Procedures: Your physician has requested that you have an echocardiogram. Echocardiography is a painless test that uses sound waves to create images of your heart. It provides your doctor with information about the size and shape of your heart and how well your heart's chambers and valves are working. This procedure takes approximately one hour. There are no restrictions for this procedure.   Follow-Up: Your physician wants you to follow-up in: 6 months with Dr. Acie Fredrickson. You will receive a reminder letter in the mail two months in advance. If you don't receive a letter, please call our office to schedule the follow-up appointment.    If you need a refill on your cardiac medications before your next appointment, please call your pharmacy.

## 2015-10-25 DIAGNOSIS — I341 Nonrheumatic mitral (valve) prolapse: Secondary | ICD-10-CM | POA: Insufficient documentation

## 2015-11-07 ENCOUNTER — Other Ambulatory Visit: Payer: Self-pay | Admitting: Internal Medicine

## 2015-11-07 DIAGNOSIS — Z1231 Encounter for screening mammogram for malignant neoplasm of breast: Secondary | ICD-10-CM

## 2015-11-16 ENCOUNTER — Ambulatory Visit (HOSPITAL_COMMUNITY): Payer: Medicare Other | Attending: Cardiology

## 2015-11-16 ENCOUNTER — Other Ambulatory Visit: Payer: Self-pay

## 2015-11-16 ENCOUNTER — Ambulatory Visit (INDEPENDENT_AMBULATORY_CARE_PROVIDER_SITE_OTHER): Payer: Medicare Other | Admitting: *Deleted

## 2015-11-16 DIAGNOSIS — R002 Palpitations: Secondary | ICD-10-CM | POA: Insufficient documentation

## 2015-11-16 DIAGNOSIS — I071 Rheumatic tricuspid insufficiency: Secondary | ICD-10-CM | POA: Diagnosis not present

## 2015-11-16 DIAGNOSIS — I440 Atrioventricular block, first degree: Secondary | ICD-10-CM | POA: Diagnosis not present

## 2015-11-16 DIAGNOSIS — E785 Hyperlipidemia, unspecified: Secondary | ICD-10-CM | POA: Insufficient documentation

## 2015-11-16 DIAGNOSIS — I4891 Unspecified atrial fibrillation: Secondary | ICD-10-CM | POA: Insufficient documentation

## 2015-11-16 DIAGNOSIS — I341 Nonrheumatic mitral (valve) prolapse: Secondary | ICD-10-CM | POA: Diagnosis present

## 2015-11-16 DIAGNOSIS — I340 Nonrheumatic mitral (valve) insufficiency: Secondary | ICD-10-CM | POA: Diagnosis not present

## 2015-11-16 DIAGNOSIS — I371 Nonrheumatic pulmonary valve insufficiency: Secondary | ICD-10-CM | POA: Insufficient documentation

## 2015-11-16 DIAGNOSIS — I272 Other secondary pulmonary hypertension: Secondary | ICD-10-CM | POA: Diagnosis not present

## 2015-11-16 DIAGNOSIS — C50919 Malignant neoplasm of unspecified site of unspecified female breast: Secondary | ICD-10-CM | POA: Diagnosis not present

## 2015-11-16 DIAGNOSIS — Z87891 Personal history of nicotine dependence: Secondary | ICD-10-CM | POA: Diagnosis not present

## 2015-11-16 LAB — BASIC METABOLIC PANEL
BUN: 17 mg/dL (ref 7–25)
CO2: 27 mmol/L (ref 20–31)
Calcium: 9.4 mg/dL (ref 8.6–10.4)
Chloride: 98 mmol/L (ref 98–110)
Creat: 0.75 mg/dL (ref 0.60–0.93)
Glucose, Bld: 88 mg/dL (ref 65–99)
Potassium: 4.3 mmol/L (ref 3.5–5.3)
Sodium: 134 mmol/L — ABNORMAL LOW (ref 135–146)

## 2015-11-16 LAB — CBC
HCT: 34.1 % — ABNORMAL LOW (ref 35.0–45.0)
Hemoglobin: 11.6 g/dL — ABNORMAL LOW (ref 11.7–15.5)
MCH: 31 pg (ref 27.0–33.0)
MCHC: 34 g/dL (ref 32.0–36.0)
MCV: 91.2 fL (ref 80.0–100.0)
MPV: 8.8 fL (ref 7.5–12.5)
Platelets: 348 10*3/uL (ref 140–400)
RBC: 3.74 MIL/uL — ABNORMAL LOW (ref 3.80–5.10)
RDW: 12.4 % (ref 11.0–15.0)
WBC: 5.1 10*3/uL (ref 3.8–10.8)

## 2015-11-17 NOTE — Progress Notes (Signed)
Pt was started on Eliquis 5mg  BID for AFIB on 04/18/15 by Dr. Acie Fredrickson.    Reviewed patients medication list.  Pt is not currently on any combined P-gp and strong CYP3A4 inhibitors/inducers (ketoconazole, traconazole, ritonavir, carbamazepine, phenytoin, rifampin, St. John's wort).  Reviewed labs.  SCr- 0.75, Weight-54kg, Hb-11.6, HCT-34.1;Dose appropriate based on age, weight, and SCr. Hgb and HCT within normal limits.    A full discussion of the nature of anticoagulants has been carried out.  A benefit/risk analysis has been presented to the patient, so that they understand the justification for choosing anticoagulation with Eliquis at this time.  The need for compliance is stressed.  Pt is aware to take the medication twice daily.  Side effects of potential bleeding are discussed, including unusual colored urine or stools, coughing up blood or coffee ground emesis, nose bleeds or serious fall or head trauma.  Discussed signs and symptoms of stroke. The patient should avoid any OTC items containing aspirin or ibuprofen.  Avoid alcohol consumption.   Call if any signs of abnormal bleeding.  Discussed financial obligations and resolved any difficulty in obtaining medication.  Pt will be followed by Cardiologist.  In 1 year, the pt will be 81 years old and if her weight remains below 60kg or if her creatinine increases to 1.5 or greater her dose will need to be decreased per dosing criteria.    Spoke with dtr & discussed lab results with her.  Advised that she can follow-up with Cardiologist for labs and any issues.  Also, advised that in 1 year the pt will be 81 years old and if her weight remains below 60kg or if her kidney function labs increase to 1.5 or greater her dose will need to be decreased & she verbalized understanding.

## 2015-11-23 ENCOUNTER — Ambulatory Visit: Payer: Medicare Other | Admitting: Neurology

## 2015-11-29 ENCOUNTER — Ambulatory Visit: Payer: Medicare Other | Admitting: Neurology

## 2015-12-12 ENCOUNTER — Ambulatory Visit
Admission: RE | Admit: 2015-12-12 | Discharge: 2015-12-12 | Disposition: A | Discharge status: Home / Self Care | Payer: Medicare Other | Source: Ambulatory Visit | Attending: Internal Medicine | Admitting: Internal Medicine

## 2015-12-12 DIAGNOSIS — Z1231 Encounter for screening mammogram for malignant neoplasm of breast: Secondary | ICD-10-CM

## 2015-12-26 ENCOUNTER — Other Ambulatory Visit: Payer: Self-pay | Admitting: Cardiovascular Disease

## 2016-04-05 ENCOUNTER — Encounter: Payer: Self-pay | Admitting: Cardiovascular Disease

## 2016-04-10 ENCOUNTER — Encounter: Payer: Self-pay | Admitting: Cardiovascular Disease

## 2016-04-10 ENCOUNTER — Ambulatory Visit (INDEPENDENT_AMBULATORY_CARE_PROVIDER_SITE_OTHER): Payer: Medicare Other | Admitting: Cardiovascular Disease

## 2016-04-10 VITALS — BP 150/90 | HR 70 | Ht 66.0 in | Wt 119.6 lb

## 2016-04-10 DIAGNOSIS — I341 Nonrheumatic mitral (valve) prolapse: Secondary | ICD-10-CM | POA: Diagnosis not present

## 2016-04-10 MED ORDER — METOPROLOL SUCCINATE ER 25 MG PO TB24: 25.0000 mg | ORAL_TABLET | Freq: Every day | ORAL | 11 refills | Status: DC

## 2016-04-10 NOTE — Patient Instructions (Signed)
Medication Instructions:  INCREASE Toprol (Metoprolol) to 25 mg once daily   Labwork: None Ordered   Testing/Procedures: None Ordered   Follow-Up: Your physician wants you to follow-up in: 6 months with Dr. Acie Fredrickson.  You will receive a reminder letter in the mail two months in advance. If you don't receive a letter, please call our office to schedule the follow-up appointment.   If you need a refill on your cardiac medications before your next appointment, please call your pharmacy.   Thank you for choosing [facility]! Christen Bame, RN [phone]

## 2016-04-10 NOTE — Progress Notes (Signed)
Candice Schultz Date of Birth  1935/01/02 [facility] [id number] [address]     [place], [place]  [address] [phone]  Fax  [phone]  Problem List: 1. Atrial Fibrillation 2. Chest pain - normal coronary arteries 2010 3.  hyperlipidemia 4. Hx of Breast cancer - 1999  History of Present Illness:  Candice Schultz has done well.  Her rhythm has remained stable. She continues to have constipation due to the Flecainide.  She takes propranolol occasionally .  She is exercising on occasion.  09/08/2012  Candice Schultz is doing well.  She has been having some memory loss and she has been holding the pravachol.  Her last LDL at Dr., Jenny Reichmann Delene Ruffini office was 122.  Total chol was 227. Trigs = 151  She remains active.  She has occasional palpitations and takes PRN propranolol.    September 10, 2013:  Candice Schultz is doing OK.  She has white coat HTN .  BP at  Home is always ok.  BP of 110 typically.   Staying in NSR. No CP or dyspnea.  She has bee losing  weight without any explanation   She had an episode CP while in [place] on Oct.   Was taken to [facility] center - cardiac cath looked great.    September 20, 2014:  Has had a good year. No CP or dyspnea  Able to do all of her normal activities without problems .  Is concerned about some weight loss Will be talking to Dr. Laurann Montana about it .  Aug. 28, 2017  Doing well from a cardiac stanpoint  No CP or dyspnea.  Not getting much exercise.  Getting out to [facility].  Feb. 13, 2018:  Doing OK Eating ok Her children are concerned about her diet and her weight loss  No CP or dyspnea  Current Outpatient Prescriptions on File Prior to Visit  Medication Sig Dispense Refill  . Calcium Citrate-Vitamin D (CALCIUM CITRATE + D PO) Take 1 tablet by mouth 2 (two) times daily.    Marland Kitchen DIGOX 125 MCG tablet TAKE 1 TABLET(125 MCG) BY MOUTH DAILY 90 tablet 2  . ELIQUIS 5 MG TABS tablet TAKE 1 TABLET BY MOUTH TWICE DAILY 60  tablet 8  . flecainide (TAMBOCOR) 100 MG tablet TAKE 1 TABLET BY MOUTH TWICE DAILY 180 tablet 2  . LORazepam (ATIVAN) 0.5 MG tablet 0.5 mg. Take 1/2 tablet by mouth twice daily as needed    . metoprolol succinate (TOPROL-XL) 25 MG 24 hr tablet Take 0.5 tablets (12.5 mg total) by mouth daily. 15 tablet 11  . Multiple Vitamin (MULTIVITAMIN) tablet Take 1 tablet by mouth every evening.     . rivastigmine (EXELON) 1.5 MG capsule Take 1 capsule (1.5 mg total) by mouth 2 (two) times daily. 60 capsule 5   No current facility-administered medications on file prior to visit.     Allergies  Allergen Reactions  . Meperidine     Other reaction(s): nausea  . Midazolam     Other reaction(s): nausea  . Pravastatin Other (See Comments)    Memory Issues  . Propafenone Hcl Er   . Sulfa Antibiotics     Other reaction(s): nausea   . Penicillins Rash    Past Medical History:  Diagnosis Date  . AF (atrial fibrillation) ([place])   . Cancer (HCC)    breast  . Chest pain   . Constipation   . Heart palpitations    related for  flecainide and due to having afib   . Hyperlipidemia     Past Surgical History:  Procedure Laterality Date  . ABDOMINAL HYSTERECTOMY    . APPENDECTOMY    . BREAST BIOPSY    . CARDIAC CATHETERIZATION  08/2008   NORMAL CORONARY ARTERIES    History  Smoking Status  . Former Smoker  . Quit date: 02/27/1983  Smokeless Tobacco  . Never Used    History  Alcohol Use No    Family History  Problem Relation Age of Onset  . Cancer Mother     Brain  . Cancer Sister     Breast  . Cancer - Other Brother     Reviw of Systems:  Reviewed in the HPI.  All other systems are negative.  Physical Exam: BP (!) 150/90 (BP Location: Right Arm, Patient Position: Sitting, Cuff Size: Normal)   Pulse 70   Ht 5\' 6"  (1.676 m)   Wt 119 lb 9.6 oz (54.3 kg)   SpO2 98%   BMI 19.30 kg/m  The patient is alert and oriented x 3.  The mood and affect are normal.  The skin is warm and dry.   Color is normal.  The HEENT exam reveals that the sclera are nonicteric.  The mucous membranes are moist.  The carotids are 2+ without bruits.  There is no thyromegaly.  There is no JVD.  The lungs are clear.  The chest wall is non tender.  The heart exam reveals a regular rate with a normal S1 and S2.  She has a A999333 systolic murmur.  The PMI is hyperdynamic today  .   Abdominal exam reveals good bowel sounds.  There is no guarding or rebound.  There is no hepatosplenomegaly or tenderness.  There are no masses.  Exam of the legs reveal no clubbing, cyanosis, or edema.  The legs are without rashes.  The distal pulses are intact.  Cranial nerves II - XII are intact.  Motor and sensory functions are intact.  The gait is normal.  ECG: Aug. 28, 2017:  NSR at 69.  1st degree AV block  LVH with repol  Assessment / Plan:   1. Atrial Fibrillation  - has maintained NSR  2. Chest pain - normal coronary arteries 2010   3.  Hyperlipidemia  4. Hx of Breast cancer - 1999  5. MVP - has a hyperdynamic PMI. Will increase Toprol XL to 25 mg a day   6. Dementia - she is clearly having some memory issues.    [name], MD  04/10/2016 2:00 PM    [place],  [place], [place]  [address] Pager [phone] Phone: [phone]; Fax: [phone]

## 2016-08-10 ENCOUNTER — Ambulatory Visit (INDEPENDENT_AMBULATORY_CARE_PROVIDER_SITE_OTHER): Payer: Medicare Other | Admitting: Neurology

## 2016-08-10 ENCOUNTER — Encounter: Payer: Self-pay | Admitting: Neurology

## 2016-08-10 VITALS — BP 138/76 | HR 60 | Ht 66.0 in | Wt 123.0 lb

## 2016-08-10 DIAGNOSIS — F0281 Dementia in other diseases classified elsewhere with behavioral disturbance: Secondary | ICD-10-CM

## 2016-08-10 DIAGNOSIS — G3183 Dementia with Lewy bodies: Secondary | ICD-10-CM

## 2016-08-10 MED ORDER — PIMAVANSERIN TARTRATE 17 MG PO TABS: 17.0000 mg | ORAL_TABLET | Freq: Every day | ORAL | 0 refills | Status: DC

## 2016-08-10 NOTE — Progress Notes (Signed)
NEUROLOGY FOLLOW UP OFFICE NOTE  Candice Schultz [id number]  HISTORY OF PRESENT ILLNESS: I had the pleasure of seeing Reisha Wos in follow-up in the neurology clinic on 08/10/2016.  The patient was last seen more than a year ago for mild dementia. MOCA score in December 2016 was 17/30. Her PCP requested for re-evaluation due to significant hallucinations and concern for possible Lewy Body dementia. She is again accompanied by her daughter who helps supplement the history today. Her daughter lives 2 doors down, she reports hallucinations that started a year ago. She has told her daughter she has seen and heard things, students sleep all over the house, in chairs, and have stolen the car and driven without permission. She asks her daughter what program these people are doing, "these students, I don't understand what they are doing." Most of the hallucinations appear visual, however they report some auditory hallucinations where "they said they own part of the deed." The patient states "those things are true." A year ago, she was frightened once hearing a voice at 1am, she opened the front door to figure out where the voices were coming from. Her daughter has not noticed her mother talking back to the hallucinations. On an almost daily basis, the patient asks where the other [name] is, "our daughter is the other [name]." She states there are people in the house right now, when they left the home, "they were friends of yours." Over the past 6-7 months, her daughter has also noticed a tremor in the right hand, initially occurring occasionally, but now almost daily for the past few months. They are worse when she is anxious or nervous. She has lost dexterity opening cans or using a knife with her fingers. She has noticed less fluidity in her mother's movements, she does not feel stiff, no falls. Her daughter is in charge of her parents' medications and food. She has been taking care of bills for the past 16  months. She can handle self-care by herself, but mostly needs help drying her hair or getting an arm in her coat/sweater. She was started on Seroquel for the hallucinations, this has helped her sleep better, but hallucinations continue. She did not tolerate BID dosing, and only takes it at night.   She currently denies any headaches, dizziness, vision changes, dysarthria/dysphagia, back pain, bladder dysfunction, anosmia.   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 She reports her memory is "sometimes excellent, sometimes not so good." She denies missing medications or missed bill payments, but her daughter states her husband had mentioned she paid a bill twice. Her family started noticing memory changes around 2 years ago. Her daughter has a list of several concerning events. Last summer, she saw her parents prepare their pillboxes, and it took her mother 1-1/2 hours to put her medications in the weekly containing, repeatedly checking and re-checking. She has seen a couple of situations with money, one time she saw her mother in the nail salon being assisted by staff with counting money. Another time they were at the laboratory for blood draw with the front desk asking for payment, she kept looking at her card and staff had to repeat amount. Her daughter has noticed more difficulties with processing information. Last summer, she had written the wrong amount on a check and another time gave her the incorrect amount for her share  in a gift they bought together. She is a good cook and good with recipes, but family has seen this dwindle, they are seeing signs that it is more difficult for her and she takes a longer time to cook things. She gets easily confused when on the phone. She used to use her laptop with emails and do puzzles in the newspaper, but the computer has  become hard and very confusing for her and she does not attempt to do it anymore. Maintaining a calendar is a challenge, she puts the appointment in a different month. She would pick up someone's purse when she is at her daughter's house thinking it was her, which was not even similar to the one she brought in. Friends have noticed this as well and asked family what is going on. One time she saw friends at the store, and they felt they had to follow her to make sure she got back to her husband fine. Her daughter has also noticed she is not as communicative, more quiet, with difficulty with her conversation skills. She would stop in the middle of a sentence and get lost, having to think of what she was saying or having difficulty finding the words. She denies getting lost driving, but her daughter reports one time where her husband had to get her and follow her home. There is no family history of dementia, although her grandmother had memory problems and was diagnosed with a brain tumor. She denies any significant head injuries or alcohol intake.   Diagnostic Data: B12 level normal 776 I personally reviewed MRI brain with and without contrast which did not show any acute changes. There was mild diffuse atrophy, minimal chronic microvascular disease.   PAST MEDICAL HISTORY: Past Medical History:  Diagnosis Date  . AF (atrial fibrillation) ([place])   . Cancer (HCC)    breast  . Chest pain   . Constipation   . Heart palpitations    related for flecainide and due to having afib   . Hyperlipidemia     MEDICATIONS: Current Outpatient Prescriptions on File Prior to Visit  Medication Sig Dispense Refill  . Calcium Citrate-Vitamin D (CALCIUM CITRATE + D PO) Take 1 tablet by mouth 2 (two) times daily.    Marland Kitchen DIGOX 125 MCG tablet TAKE 1 TABLET(125 MCG) BY MOUTH DAILY 90 tablet 2  . ELIQUIS 5 MG TABS tablet TAKE 1 TABLET BY MOUTH TWICE DAILY 60 tablet 8  . flecainide (TAMBOCOR) 100 MG tablet TAKE 1 TABLET BY  MOUTH TWICE DAILY 180 tablet 2  . LORazepam (ATIVAN) 0.5 MG tablet 0.5 mg. Take 1/2 tablet by mouth twice daily as needed    . metoprolol succinate (TOPROL XL) 25 MG 24 hr tablet Take 1 tablet (25 mg total) by mouth daily. 30 tablet 11  . Multiple Vitamin (MULTIVITAMIN) tablet Take 1 tablet by mouth every evening.     . rivastigmine (EXELON) 1.5 MG capsule Take 1 capsule (1.5 mg total) by mouth 2 (two) times daily. 60 capsule 5   No current facility-administered medications on file prior to visit.     ALLERGIES: Allergies  Allergen Reactions  . Meperidine     Other reaction(s): nausea  . Midazolam     Other reaction(s): nausea  . Pravastatin Other (See Comments)    Memory Issues  . Propafenone Hcl Er   . Sulfa Antibiotics     Other reaction(s): nausea   . Penicillins Rash        FAMILY HISTORY:  Family History  Problem Relation Age of Onset  . Cancer Mother        Brain  . Cancer Sister        Breast  . Cancer - Other Brother     SOCIAL HISTORY: Social History   Social History  . Marital status: Married    Spouse name: N/A  . Number of children: 2  . Years of education: N/A   Occupational History  . Retired    Social History Main Topics  . Smoking status: Former Smoker    Quit date: 02/27/1983  . Smokeless tobacco: Never Used  . Alcohol use No  . Drug use: No  . Sexual activity: Not on file   Other Topics Concern  . Not on file   Social History Narrative  . No narrative on file    REVIEW OF SYSTEMS: Constitutional: No fevers, chills, or sweats, no generalized fatigue, change in appetite Eyes: No visual changes, double vision, eye pain Ear, nose and throat: No hearing loss, ear pain, nasal congestion, sore throat Cardiovascular: No chest pain, palpitations Respiratory:  No shortness of breath at rest or with exertion, wheezes GastrointestinaI: No nausea, vomiting, diarrhea, abdominal pain, fecal incontinence Genitourinary:  No dysuria, urinary  retention or frequency Musculoskeletal:  No neck pain, back pain Integumentary: No rash, pruritus, skin lesions Neurological: as above Psychiatric: No depression, insomnia, anxiety Endocrine: No palpitations, fatigue, diaphoresis, mood swings, change in appetite, change in weight, increased thirst Hematologic/Lymphatic:  No anemia, purpura, petechiae. Allergic/Immunologic: no itchy/runny eyes, nasal congestion, recent allergic reactions, rashes  PHYSICAL EXAM: Vitals:   08/10/16 1131  BP: 138/76  Pulse: 60   General: No acute distress, flat affect Head:  Normocephalic/atraumatic Neck: supple, no paraspinal tenderness, full range of motion Back: No paraspinal tenderness Heart: regular rate and rhythm Lungs: Clear to auscultation bilaterally. Vascular: No carotid bruits. Skin/Extremities: No rash, no edema Neurological Exam: Mental status: alert and oriented to person, place, states it is Sept 2019, fall (it is June 2018, spring), no dysarthria or aphasia, Fund of knowledge is appropriate.  Recent and remot memory impaired.  Attention and concentration are reduced. Able to name objects and repeat phrases. CDT 1/5 MMSE - Mini Mental State Exam 08/10/2016  Orientation to time 1  Orientation to Place 5  Registration 3  Attention/ Calculation 2  Recall 3  Language- name 2 objects 2  Language- repeat 1  Language- follow 3 step command 2  Language- read & follow direction 1  Write a sentence 1  Copy design 0  Total score 21   Cranial nerves: CN I: not tested CN II: pupils equal, round and reactive to light, visual fields intact CN III, IV, VI:  full range of motion, no nystagmus, no ptosis CN V: facial sensation intact CN VII: upper and lower face symmetric CN VIII: hearing intact to finger rub CN IX, X: gag intact, uvula midline CN XI: sternocleidomastoid and trapezius muscles intact CN XII: tongue midline Bulk & Tone: normal, +right cogwheeling, no fasciculations. Motor:  5/5 throughout with no pronator drift. Sensation: intact to light touch, cold, pin, vibration and joint position sense.  No extinction to double simultaneous stimulation.  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 MMSE today 21/30. Symptoms now concerning for Lewy Body dementia. The diagnosis was discussed with the patient and her daughter, we discussed medications, and agreed on focusing on management of hallucinations for now, they feel the tremors are tolerable. She had some benefit with Seroquel with regards to sleep, but has not noticed much improvement with the hallucinations. We discussed other options, her daughter would like to try Nuplazid, we discussed side effects, including cardiac black box warning. She was given samples to take for the next 2 weeks, then her daughter will call us for update. Continue Rivastigmine 1.5mg  BID. Continue 24/7 care, we discussed home safety. She will follow-up in 5 months and knows to call for any changes.   Thank you for allowing me to participate in her care.  Please do not hesitate to call for any questions or concerns.  The duration of this appointment visit was 25 minutes of face-to-face time with the patient.  Greater  than 50% of this time was spent in counseling, explanation of diagnosis, planning of further management, and coordination of care.   Ellouise Newer, M.D.   CC: Dr. Laurann Montana

## 2016-08-10 NOTE — Patient Instructions (Signed)
1. Start Nuplazid 17mg : Take 1 tablet daily for 5 days, then increase to 2 tablets daily. Stop the Seroquel. Call our office for an update in 2 weeks 2. Continue all medications 3. Continue home safety, 24/7 care 4. Follow-up in 5 months, call for any changes

## 2016-08-15 ENCOUNTER — Encounter: Payer: Self-pay | Admitting: Neurology

## 2016-08-15 DIAGNOSIS — G3183 Dementia with Lewy bodies: Principal | ICD-10-CM

## 2016-08-15 DIAGNOSIS — F0281 Dementia in other diseases classified elsewhere with behavioral disturbance: Secondary | ICD-10-CM | POA: Insufficient documentation

## 2016-08-27 ENCOUNTER — Other Ambulatory Visit: Payer: Self-pay

## 2016-08-27 ENCOUNTER — Telehealth: Payer: Self-pay | Admitting: Neurology

## 2016-08-27 MED ORDER — PIMAVANSERIN TARTRATE 17 MG PO TABS: 34.0000 mg | ORAL_TABLET | Freq: Every day | ORAL | 6 refills | Status: DC

## 2016-08-27 NOTE — Telephone Encounter (Signed)
Spoke with pt daughter, Candice Schultz, who states that pt was doing great on Nuplazid 17mg  BID.  Once they increased to 34mg  BID pt started getting excessive dry skin on her abdomin.  Candice Schultz is thinking this is more due to not applying lotion after showering, but was also wondering if stopping Seroquel can cause that?    Candice Schultz states that pt is seeing hallucinations constantly now. She seems to be more confused too, not recognizing her children or husband.    Candice Schultz states that pt has enough Nuplazid to last until either the 7th or 8th of this month and is requesting Rx be sent.  She also says that Dr. Delice Lesch mentioned the possibility of adding another medication??  Please advise.

## 2016-08-27 NOTE — Telephone Encounter (Signed)
Spoke with daughter relaying message below.  Noticed that I had my information wrong - pt is currently taking Nuplazid 34mg  once a day (2 tablets total).  [name] states that she will continue to give pt 2 tablets a day.  She is hopeful that Nuplazid will help so she is agreeable to give it some more time.

## 2016-08-27 NOTE — Telephone Encounter (Signed)
PT's daughter [name] left a voicemail message regarding her medication Nuplazid and how she was tolerating it and wanted a call back to discuss that

## 2016-08-27 NOTE — Telephone Encounter (Signed)
Pls have her go down to 34mg  once a day (2 tablets once a day), and pls send an Rx for Nuplazid 17mg , take 2 tablets daily, dispense #60 with 6 refills.  Stopping the Seroquel would not cause the dry skin. The confusion may be due to taking the Nuplazid twice a day, just take it once a day. Hold off on starting a new medication for now, let's give the Nuplazid a little more time. Thanks

## 2016-08-30 ENCOUNTER — Telehealth: Payer: Self-pay | Admitting: Neurology

## 2016-08-30 NOTE — Telephone Encounter (Signed)
Patient daughter would like to talk with someone about helping mother sleep at night

## 2016-08-31 MED ORDER — MIRTAZAPINE 15 MG PO TABS: ORAL_TABLET | ORAL | 6 refills | Status: DC

## 2016-08-31 NOTE — Telephone Encounter (Signed)
Pls let her daughter know I have sent in a prescription for mirtazapine 15mg  at bedtime. Main side effect is drowsiness. Let's try it and see if helps with sleep. Thanks

## 2016-08-31 NOTE — Telephone Encounter (Signed)
Spoke with pt daughter relaying message below 

## 2016-08-31 NOTE — Telephone Encounter (Signed)
Spoke with pt daughter, [name], who states that pt was up wandering for 3 nights in a row earlier this week.  She did manage to stay asleep last night.  Beverly asked if there is something pt can take to help her sleep at night.  Please advise.  [name] also stated that she went to the pharmacy to pickup pt's Rx for Nuplazid and the pharmacy stated that they never received it.  System shows confirmation from pharmacy.  Will call about this.

## 2016-08-31 NOTE — Telephone Encounter (Signed)
Called pharmacy who stated they never received Rx.  Verbally gave Rx information to pharmacist.

## 2016-08-31 NOTE — Telephone Encounter (Signed)
PT's daughter called and said  The pharmacy  has not received her prescription for Nuplazid yet and also a call back about her not sleeping

## 2016-09-02 ENCOUNTER — Encounter (HOSPITAL_COMMUNITY): Payer: Self-pay | Admitting: Emergency Medicine

## 2016-09-02 ENCOUNTER — Emergency Department (HOSPITAL_COMMUNITY): Payer: Medicare Other

## 2016-09-02 DIAGNOSIS — Z853 Personal history of malignant neoplasm of breast: Secondary | ICD-10-CM | POA: Insufficient documentation

## 2016-09-02 DIAGNOSIS — I440 Atrioventricular block, first degree: Secondary | ICD-10-CM | POA: Diagnosis not present

## 2016-09-02 DIAGNOSIS — Z880 Allergy status to penicillin: Secondary | ICD-10-CM | POA: Diagnosis not present

## 2016-09-02 DIAGNOSIS — Z882 Allergy status to sulfonamides status: Secondary | ICD-10-CM | POA: Insufficient documentation

## 2016-09-02 DIAGNOSIS — I480 Paroxysmal atrial fibrillation: Secondary | ICD-10-CM | POA: Diagnosis not present

## 2016-09-02 DIAGNOSIS — I10 Essential (primary) hypertension: Secondary | ICD-10-CM | POA: Insufficient documentation

## 2016-09-02 DIAGNOSIS — F0281 Dementia in other diseases classified elsewhere with behavioral disturbance: Secondary | ICD-10-CM | POA: Diagnosis not present

## 2016-09-02 DIAGNOSIS — Z7901 Long term (current) use of anticoagulants: Secondary | ICD-10-CM | POA: Diagnosis not present

## 2016-09-02 DIAGNOSIS — I341 Nonrheumatic mitral (valve) prolapse: Secondary | ICD-10-CM | POA: Diagnosis not present

## 2016-09-02 DIAGNOSIS — E785 Hyperlipidemia, unspecified: Secondary | ICD-10-CM | POA: Insufficient documentation

## 2016-09-02 DIAGNOSIS — E871 Hypo-osmolality and hyponatremia: Secondary | ICD-10-CM | POA: Diagnosis not present

## 2016-09-02 DIAGNOSIS — Z888 Allergy status to other drugs, medicaments and biological substances status: Secondary | ICD-10-CM | POA: Insufficient documentation

## 2016-09-02 DIAGNOSIS — Z79899 Other long term (current) drug therapy: Secondary | ICD-10-CM | POA: Diagnosis not present

## 2016-09-02 DIAGNOSIS — G3189 Other specified degenerative diseases of nervous system: Secondary | ICD-10-CM | POA: Diagnosis not present

## 2016-09-02 DIAGNOSIS — G3183 Dementia with Lewy bodies: Secondary | ICD-10-CM | POA: Insufficient documentation

## 2016-09-02 DIAGNOSIS — R001 Bradycardia, unspecified: Secondary | ICD-10-CM | POA: Insufficient documentation

## 2016-09-02 DIAGNOSIS — Z87891 Personal history of nicotine dependence: Secondary | ICD-10-CM | POA: Diagnosis not present

## 2016-09-02 LAB — CBC
HCT: 33.9 % — ABNORMAL LOW (ref 36.0–46.0)
HEMOGLOBIN: 11.8 g/dL — AB (ref 12.0–15.0)
MCH: 31.6 pg (ref 26.0–34.0)
MCHC: 34.8 g/dL (ref 30.0–36.0)
MCV: 90.6 fL (ref 78.0–100.0)
Platelets: 343 10*3/uL (ref 150–400)
RBC: 3.74 MIL/uL — ABNORMAL LOW (ref 3.87–5.11)
RDW: 11.9 % (ref 11.5–15.5)
WBC: 10.3 10*3/uL (ref 4.0–10.5)

## 2016-09-02 LAB — BASIC METABOLIC PANEL
Anion gap: 7 (ref 5–15)
BUN: 20 mg/dL (ref 6–20)
CALCIUM: 9.4 mg/dL (ref 8.9–10.3)
CO2: 28 mmol/L (ref 22–32)
CREATININE: 0.85 mg/dL (ref 0.44–1.00)
Chloride: 89 mmol/L — ABNORMAL LOW (ref 101–111)
GFR calc Af Amer: >60 mL/min (ref >60)
GLUCOSE: 153 mg/dL — AB (ref 65–99)
Potassium: 3.7 mmol/L (ref 3.5–5.1)
Sodium: 124 mmol/L — ABNORMAL LOW (ref 135–145)

## 2016-09-02 LAB — I-STAT TROPONIN, ED: TROPONIN I, POC: 0 ng/mL (ref 0.00–0.08)

## 2016-09-02 LAB — PROTIME-INR
INR: 1.18
Prothrombin Time: 15.1 seconds (ref 11.4–15.2)

## 2016-09-02 MED ORDER — ONDANSETRON HCL 4 MG/2ML IJ SOLN
INTRAMUSCULAR | Status: AC
  Filled 2016-09-02: qty 2

## 2016-09-02 MED ORDER — ONDANSETRON HCL 4 MG/2ML IJ SOLN
4.0000 mg | Freq: Once | INTRAMUSCULAR | Status: AC | PRN
  Administered 2016-09-02: 4 mg via INTRAVENOUS

## 2016-09-02 NOTE — ED Notes (Signed)
Patient transported to X-ray 

## 2016-09-02 NOTE — ED Triage Notes (Signed)
Recent change in BP meds (doubled the dose of her metoprolol)

## 2016-09-02 NOTE — ED Triage Notes (Signed)
BIB EMS from home, pt daughter called out b/c she found pt laying on couch which is unlike her, checked BP and noted to be 552 systolic at home. Pt A/OX4 but slow to respond, denies any pain.

## 2016-09-03 ENCOUNTER — Emergency Department (HOSPITAL_COMMUNITY): Payer: Medicare Other

## 2016-09-03 ENCOUNTER — Encounter (HOSPITAL_COMMUNITY): Payer: Self-pay | Admitting: Family Medicine

## 2016-09-03 ENCOUNTER — Observation Stay (HOSPITAL_COMMUNITY)
Admission: EM | Admit: 2016-09-03 | Discharge: 2016-09-04 | Disposition: A | Discharge status: Home / Self Care | Payer: Medicare Other | Attending: Internal Medicine | Admitting: Internal Medicine

## 2016-09-03 ENCOUNTER — Telehealth: Payer: Self-pay | Admitting: Neurology

## 2016-09-03 ENCOUNTER — Other Ambulatory Visit: Payer: Self-pay

## 2016-09-03 DIAGNOSIS — I10 Essential (primary) hypertension: Secondary | ICD-10-CM | POA: Insufficient documentation

## 2016-09-03 DIAGNOSIS — R001 Bradycardia, unspecified: Secondary | ICD-10-CM | POA: Insufficient documentation

## 2016-09-03 DIAGNOSIS — F0281 Dementia in other diseases classified elsewhere with behavioral disturbance: Secondary | ICD-10-CM | POA: Diagnosis not present

## 2016-09-03 DIAGNOSIS — I480 Paroxysmal atrial fibrillation: Secondary | ICD-10-CM | POA: Diagnosis not present

## 2016-09-03 DIAGNOSIS — F0390 Unspecified dementia without behavioral disturbance: Secondary | ICD-10-CM | POA: Diagnosis present

## 2016-09-03 DIAGNOSIS — E871 Hypo-osmolality and hyponatremia: Secondary | ICD-10-CM | POA: Diagnosis present

## 2016-09-03 DIAGNOSIS — G3183 Dementia with Lewy bodies: Secondary | ICD-10-CM

## 2016-09-03 DIAGNOSIS — F02818 Dementia in other diseases classified elsewhere, unspecified severity, with other behavioral disturbance: Secondary | ICD-10-CM | POA: Diagnosis present

## 2016-09-03 DIAGNOSIS — F03A0 Unspecified dementia, mild, without behavioral disturbance, psychotic disturbance, mood disturbance, and anxiety: Secondary | ICD-10-CM | POA: Diagnosis present

## 2016-09-03 DIAGNOSIS — I4891 Unspecified atrial fibrillation: Secondary | ICD-10-CM | POA: Diagnosis present

## 2016-09-03 HISTORY — DX: Anxiety disorder, unspecified: F41.9

## 2016-09-03 HISTORY — DX: Essential (primary) hypertension: I10

## 2016-09-03 HISTORY — DX: Unspecified dementia, unspecified severity, without behavioral disturbance, psychotic disturbance, mood disturbance, and anxiety: F03.90

## 2016-09-03 HISTORY — DX: Family history of other specified conditions: Z84.89

## 2016-09-03 LAB — URINALYSIS, ROUTINE W REFLEX MICROSCOPIC
BILIRUBIN URINE: NEGATIVE
Glucose, UA: NEGATIVE mg/dL
HGB URINE DIPSTICK: NEGATIVE
Ketones, ur: NEGATIVE mg/dL
Leukocytes, UA: NEGATIVE
Nitrite: NEGATIVE
Protein, ur: NEGATIVE mg/dL
SPECIFIC GRAVITY, URINE: 1.009 (ref 1.005–1.030)
pH: 6 (ref 5.0–8.0)

## 2016-09-03 LAB — HEPATIC FUNCTION PANEL
ALT: 17 U/L (ref 14–54)
AST: 19 U/L (ref 15–41)
Albumin: 3.7 g/dL (ref 3.5–5.0)
Alkaline Phosphatase: 43 U/L (ref 38–126)
BILIRUBIN DIRECT: 0.2 mg/dL (ref 0.1–0.5)
BILIRUBIN INDIRECT: 0.8 mg/dL (ref 0.3–0.9)
TOTAL PROTEIN: 6.2 g/dL — AB (ref 6.5–8.1)
Total Bilirubin: 1 mg/dL (ref 0.3–1.2)

## 2016-09-03 LAB — DIGOXIN LEVEL: DIGOXIN LVL: 0.8 ng/mL (ref 0.8–2.0)

## 2016-09-03 LAB — SODIUM, URINE, RANDOM
SODIUM UR: 104 mmol/L
Sodium, Ur: 53 mmol/L

## 2016-09-03 LAB — TSH: TSH: 1.323 u[IU]/mL (ref 0.350–4.500)

## 2016-09-03 LAB — GLUCOSE, CAPILLARY
GLUCOSE-CAPILLARY: 112 mg/dL — AB (ref 65–99)
GLUCOSE-CAPILLARY: 93 mg/dL (ref 65–99)
GLUCOSE-CAPILLARY: 108 mg/dL — AB (ref 65–99)
Glucose-Capillary: 122 mg/dL — ABNORMAL HIGH (ref 65–99)
Glucose-Capillary: 108 mg/dL — ABNORMAL HIGH (ref 65–99)

## 2016-09-03 LAB — BASIC METABOLIC PANEL
Anion gap: 6 (ref 5–15)
BUN: 14 mg/dL (ref 6–20)
CALCIUM: 8.4 mg/dL — AB (ref 8.9–10.3)
CO2: 28 mmol/L (ref 22–32)
Chloride: 93 mmol/L — ABNORMAL LOW (ref 101–111)
Creatinine, Ser: 0.78 mg/dL (ref 0.44–1.00)
GFR calc Af Amer: >60 mL/min (ref >60)
GLUCOSE: 103 mg/dL — AB (ref 65–99)
Potassium: 3.6 mmol/L (ref 3.5–5.1)
SODIUM: 127 mmol/L — AB (ref 135–145)

## 2016-09-03 LAB — CREATININE, URINE, RANDOM
CREATININE, URINE: 27.19 mg/dL
Creatinine, Urine: 67.23 mg/dL

## 2016-09-03 LAB — OSMOLALITY, URINE
OSMOLALITY UR: 389 mosm/kg (ref 300–900)
Osmolality, Ur: 403 mOsm/kg (ref 300–900)

## 2016-09-03 LAB — OSMOLALITY: OSMOLALITY: 272 mosm/kg — AB (ref 275–295)

## 2016-09-03 MED ORDER — INSULIN ASPART 100 UNIT/ML ~~LOC~~ SOLN: 0.0000 [IU] | Freq: Three times a day (TID) | SUBCUTANEOUS | Status: DC

## 2016-09-03 MED ORDER — DOCUSATE SODIUM 100 MG PO CAPS
100.0000 mg | ORAL_CAPSULE | Freq: Two times a day (BID) | ORAL | Status: DC
  Administered 2016-09-03 – 2016-09-04 (×3): 100 mg via ORAL
  Filled 2016-09-03 (×3): qty 1

## 2016-09-03 MED ORDER — ONDANSETRON HCL 4 MG PO TABS: 4.0000 mg | ORAL_TABLET | Freq: Four times a day (QID) | ORAL | Status: DC | PRN

## 2016-09-03 MED ORDER — PIMAVANSERIN TARTRATE 17 MG PO TABS: 34.0000 mg | ORAL_TABLET | Freq: Every day | ORAL | 0 refills | Status: DC

## 2016-09-03 MED ORDER — SODIUM CHLORIDE 0.9% FLUSH
3.0000 mL | Freq: Two times a day (BID) | INTRAVENOUS | Status: DC
  Administered 2016-09-03 – 2016-09-04 (×2): 3 mL via INTRAVENOUS

## 2016-09-03 MED ORDER — INSULIN ASPART 100 UNIT/ML ~~LOC~~ SOLN: 0.0000 [IU] | Freq: Every day | SUBCUTANEOUS | Status: DC

## 2016-09-03 MED ORDER — SODIUM CHLORIDE 0.9 % IV SOLN
INTRAVENOUS | Status: DC
  Administered 2016-09-03: 10:00:00 via INTRAVENOUS

## 2016-09-03 MED ORDER — APIXABAN 5 MG PO TABS
5.0000 mg | ORAL_TABLET | Freq: Two times a day (BID) | ORAL | Status: DC
  Administered 2016-09-03 – 2016-09-04 (×3): 5 mg via ORAL
  Filled 2016-09-03 (×3): qty 1

## 2016-09-03 MED ORDER — LORAZEPAM 0.5 MG PO TABS: 0.2500 mg | ORAL_TABLET | Freq: Four times a day (QID) | ORAL | Status: DC | PRN

## 2016-09-03 MED ORDER — SODIUM CHLORIDE 0.9% FLUSH: 3.0000 mL | INTRAVENOUS | Status: DC | PRN

## 2016-09-03 MED ORDER — HYDRALAZINE HCL 20 MG/ML IJ SOLN: 10.0000 mg | INTRAMUSCULAR | Status: DC | PRN

## 2016-09-03 MED ORDER — FLECAINIDE ACETATE 100 MG PO TABS
100.0000 mg | ORAL_TABLET | Freq: Two times a day (BID) | ORAL | Status: DC
  Administered 2016-09-03 – 2016-09-04 (×3): 100 mg via ORAL
  Filled 2016-09-03 (×5): qty 1

## 2016-09-03 MED ORDER — SODIUM CHLORIDE 0.9% FLUSH
3.0000 mL | Freq: Two times a day (BID) | INTRAVENOUS | Status: DC
  Administered 2016-09-03 – 2016-09-04 (×3): 3 mL via INTRAVENOUS

## 2016-09-03 MED ORDER — DIGOXIN 125 MCG PO TABS
0.1250 mg | ORAL_TABLET | Freq: Every day | ORAL | Status: DC
  Administered 2016-09-04: 0.125 mg via ORAL
  Filled 2016-09-03 (×2): qty 1

## 2016-09-03 MED ORDER — ONDANSETRON HCL 4 MG/2ML IJ SOLN: 4.0000 mg | Freq: Four times a day (QID) | INTRAMUSCULAR | Status: DC | PRN

## 2016-09-03 MED ORDER — HYDROCODONE-ACETAMINOPHEN 5-325 MG PO TABS: 1.0000 | ORAL_TABLET | ORAL | Status: DC | PRN

## 2016-09-03 MED ORDER — SODIUM CHLORIDE 0.9 % IV SOLN: 250.0000 mL | INTRAVENOUS | Status: DC | PRN

## 2016-09-03 MED ORDER — MIRTAZAPINE 15 MG PO TABS
15.0000 mg | ORAL_TABLET | Freq: Every day | ORAL | Status: DC
  Administered 2016-09-03: 15 mg via ORAL
  Filled 2016-09-03: qty 1

## 2016-09-03 MED ORDER — POLYVINYL ALCOHOL 1.4 % OP SOLN: 1.0000 [drp] | Freq: Three times a day (TID) | OPHTHALMIC | Status: DC | PRN

## 2016-09-03 MED ORDER — ADULT MULTIVITAMIN W/MINERALS CH
1.0000 | ORAL_TABLET | Freq: Every evening | ORAL | Status: DC
  Administered 2016-09-03 – 2016-09-04 (×2): 1 via ORAL
  Filled 2016-09-03 (×2): qty 1

## 2016-09-03 MED ORDER — ACETAMINOPHEN 650 MG RE SUPP: 650.0000 mg | Freq: Four times a day (QID) | RECTAL | Status: DC | PRN

## 2016-09-03 MED ORDER — ACETAMINOPHEN 325 MG PO TABS: 650.0000 mg | ORAL_TABLET | Freq: Four times a day (QID) | ORAL | Status: DC | PRN

## 2016-09-03 MED ORDER — SODIUM CHLORIDE 0.9 % IV BOLUS (SEPSIS)
1000.0000 mL | Freq: Once | INTRAVENOUS | Status: AC
  Administered 2016-09-03: 1000 mL via INTRAVENOUS

## 2016-09-03 NOTE — ED Notes (Signed)
Pt to CT via stretcher

## 2016-09-03 NOTE — ED Provider Notes (Signed)
[facility] DEPT Provider Note   CSN: [id number] Arrival date & time: 09/02/16  2218     History   Chief Complaint Chief Complaint  Patient presents with  . Hypertension    HPI Candice Schultz is a 82 y.o. female.  Patient brought in by daughter (caregiver) for evaluation of dizziness and nausea tonight. The patient has not complained of pain. Per daughter, she had an uneventful day otherwise. When the daughter returned from church tonight, she found her mother in a chair with her head between her knees with complaint of dizziness. She has had this previously but reports symptoms much worse and associated with intense nausea without vomiting. Daughter took her blood pressure and found it elevated to 180 and became concerned.  No recent fever or illness. No cough, congestion. No known urinary symptoms. She has dementia and daughter reports no significant changes to her baseline other than fatigue, which worsens her dementia.    The history is provided by the patient. No language interpreter was used.    Past Medical History:  Diagnosis Date  . AF (atrial fibrillation) ([place])   . Cancer (HCC)    breast  . Chest pain   . Constipation   . Heart palpitations    related for flecainide and due to having afib   . Hyperlipidemia     Patient Active Problem List   Diagnosis Date Noted  . Lewy body dementia with behavioral disturbance 08/15/2016  . Mitral valve prolapse 10/25/2015  . Mild dementia 02/25/2015  . Encounter for therapeutic drug monitoring 04/24/2013  . History of breast cancer 12/11/2010  . Chest pain   . Hyperlipidemia   . Atrial fibrillation ([place]) 06/06/2010  . Long term current use of anticoagulant 06/06/2010    Past Surgical History:  Procedure Laterality Date  . ABDOMINAL HYSTERECTOMY    . APPENDECTOMY    . BREAST BIOPSY    . CARDIAC CATHETERIZATION  08/2008   NORMAL CORONARY ARTERIES    OB History    No data available       Home Medications     Prior to Admission medications   Medication Sig Start Date End Date Taking? Authorizing Provider  Calcium Citrate-Vitamin D (CALCIUM CITRATE + D PO) Take 1 tablet by mouth 2 (two) times daily.    [provider]  [place] 125 MCG tablet TAKE 1 TABLET(125 MCG) BY MOUTH DAILY 12/26/15   Nahser, Wonda Cheng, MD  ELIQUIS 5 MG TABS tablet TAKE 1 TABLET BY MOUTH TWICE DAILY 12/26/15   Nahser, Wonda Cheng, MD  flecainide (TAMBOCOR) 100 MG tablet TAKE 1 TABLET BY MOUTH TWICE DAILY 12/26/15   Nahser, Wonda Cheng, MD  LORazepam (ATIVAN) 0.5 MG tablet 0.5 mg. Take 1/2 tablet by mouth twice daily as needed 12/22/12   [provider]  metoprolol succinate (TOPROL XL) 25 MG 24 hr tablet Take 1 tablet (25 mg total) by mouth daily. 04/10/16   Nahser, Wonda Cheng, MD  mirtazapine (REMERON) 15 MG tablet Take 1 tablet at night 08/31/16   Cameron Sprang, MD  Multiple Vitamin (MULTIVITAMIN) tablet Take 1 tablet by mouth every evening.     [provider]  Pimavanserin Tartrate (NUPLAZID) 17 MG TABS Take 34 mg by mouth daily. 08/27/16   Cameron Sprang, MD  QUEtiapine (SEROQUEL) 25 MG tablet TK 1 T PO BID 07/17/16   [provider]    Family History Family History  Problem Relation Age of Onset  . Cancer Mother  Brain  . Cancer Sister        Breast  . Cancer - Other Brother     Social History Social History  Substance Use Topics  . Smoking status: Former Smoker    Quit date: 02/27/1983  . Smokeless tobacco: Never Used  . Alcohol use No     Allergies   Meperidine; Midazolam; Pravastatin; Propafenone hcl er; Sulfa antibiotics; and Penicillins   Review of Systems Review of Systems  Unable to perform ROS: Dementia     Physical Exam Updated Vital Signs BP (!) 96/43 (BP Location: Right Arm)   Pulse (!) 52   Temp 97.9 F (36.6 C) (Oral)   Resp 16   Ht 5\' 6"  (1.676 m)   Wt 56.7 kg (125 lb)   SpO2 98%   BMI 20.18 kg/m   Physical Exam  Constitutional: She appears  well-developed and well-nourished. No distress.  Somnolent, easily awakened.   HENT:  Head: Normocephalic and atraumatic.  Eyes: Pupils are equal, round, and reactive to light.  Neck: Normal range of motion. Neck supple.  Cardiovascular: Regular rhythm.  Bradycardia present.   No murmur heard. Pulmonary/Chest: Effort normal and breath sounds normal. She has no wheezes. She has no rales. She exhibits no tenderness.  Abdominal: Soft. Bowel sounds are normal. There is no tenderness. There is no rebound and no guarding.  Musculoskeletal: Normal range of motion.  Neurological: She is alert.  Follows commands. No lateralizing weakness.   Skin: Skin is warm and dry. No rash noted. She is not diaphoretic.  Psychiatric: She has a normal mood and affect.     ED Treatments / Results  Labs (all labs ordered are listed, but only abnormal results are displayed) Labs Reviewed  BASIC METABOLIC PANEL - Abnormal; Notable for the following:       Result Value   Sodium 124 (*)    Chloride 89 (*)    Glucose, Bld 153 (*)    All other components within normal limits  CBC - Abnormal; Notable for the following:    RBC 3.74 (*)    Hemoglobin 11.8 (*)    HCT 33.9 (*)    All other components within normal limits  PROTIME-INR  URINALYSIS, ROUTINE W REFLEX MICROSCOPIC  I-STAT TROPOININ, ED  CBG MONITORING, ED    EKG  EKG Interpretation  Date/Time:  Sunday September 02 2016 22:22:40 EDT Ventricular Rate:  52 PR Interval:  234 QRS Duration: 90 QT Interval:  452 QTC Calculation: 420 R Axis:   40 Text Interpretation:  Sinus bradycardia with 1st degree A-V block Left ventricular hypertrophy with repolarization abnormality Abnormal ECG bradycardia is new, otherwise ST depressions similar to Dec 2016 Confirmed by Sherwood Gambler [phone]) on 09/02/2016 10:28:30 PM       Radiology Dg Chest 2 View  Result Date: 09/02/2016 CLINICAL DATA:  Hypertension and nausea EXAM: CHEST  2 VIEW COMPARISON:  02/26/2015, CT  03/04/2015 FINDINGS: No acute pulmonary infiltrate or effusion is seen. A focal opacity at the left base could relate to eventration of the diaphragm. Heart size within normal limits. Mediastinal contour unremarkable. No pneumothorax. IMPRESSION: No active cardiopulmonary disease. Electronically Signed   By: Donavan Foil M.D.   On: 09/02/2016 23:42    Procedures Procedures (including critical care time)  Medications Ordered in ED Medications  ondansetron (ZOFRAN) injection 4 mg (4 mg Intravenous Given 09/02/16 2241)     Initial Impression / Assessment and Plan / ED Course  I have reviewed the triage  vital signs and the nursing notes.  Pertinent labs & imaging results that were available during my care of the patient were reviewed by me and considered in my medical decision making (see chart for details).     Patient here with daughter who is caregiver with concern for dizziness, nausea and elevated blood pressure. She has dementia that seems stable to her. No fever.   The patient is somewhat somnolent here but daughter states she has had her nighttime medications that make her sleepy. Mirtazapine recently started. The patient wakes easily and follows command.   Labs show a significant hyponatremia of 124. IV NS bolused. Discussed with Dr. Kathrynn Humble who has seen the patient. She will need to be admitted for further evaluation and treatment. [facility] accepting.   Final Clinical Impressions(s) / ED Diagnoses   Final diagnoses:  None   1. Hyponatremia 2. Bradycardia   New Prescriptions New Prescriptions   No medications on file     Charlann Lange, Hershal Coria 09/03/16 [place], [place], MD 09/03/16 2356

## 2016-09-03 NOTE — Progress Notes (Signed)
Patient admitted after midnight, please see H&P.  Await Na recheck at 12.  Urine studies still pending.  New medication started few weeks ago: nuplazid and mirtazapine  Eulogio Bear DO

## 2016-09-03 NOTE — Telephone Encounter (Signed)
Pt daughter stopped by office and picked up samples

## 2016-09-03 NOTE — Telephone Encounter (Signed)
PT's daughter called and wanted to know if we had any samples of Nuplazid she could have until they get there's in the mail

## 2016-09-03 NOTE — Telephone Encounter (Signed)
Wende Neighbors (daughter) called back. She said her mom is out if the Nuplazid but will get it tomorrow through mail order. She is at [facility] now and they told her that if she could get the samples today that they could administer it to her? Please call her daughter. Thanks

## 2016-09-03 NOTE — H&P (Signed)
History and Physical    EYONNA Schultz NOM:[id number] DOB: 01/02/1935 DOA: 09/03/2016  PCP: Lavone Orn, MD   Patient coming from: Home  Chief Complaint: Hypertension, bradycardia, malaise   HPI: Candice Schultz is a 82 y.o. female with medical history significant for atrial fibrillation on Eliquis, Lewy body dementia followed by neurology, and hypertension, now presenting to the emergency department for evaluation of malaise, hypertension, and bradycardia. She is accompanied by her husband and daughter who assist with the history. She has reportedly been experiencing increasing problems related to her Lewy body dementia and was started on Nuplazid and Remeron recently by her neurologist. She had otherwise been doing well until complaining of a general feeling of unwellness last night. She has seemed to be a little more lethargic and her daughter has been monitoring her closely, checking her CBG, blood pressure, and pulse rates frequently overnight. CBG was normal, but she noted increasing systolic blood pressures with lower than usual DBP and pulse rate as low as 42. Eventually, this prompted her to call 911 for transport to the hospital. Patient is unable to contribute to the history secondary to her clinical condition with dementia.  ED Course: Upon arrival to the ED, patient is found to be afebrile, saturating well on room air, bradycardic in the low 50s, and mildly hypertensive. EKG features a sinus bradycardia with rate 52, first-degree AV nodal block, and LVH by voltage criteria with secondary repolarization abnormality. Noncontrast head CT is negative for acute intracranial abnormality. Chest x-ray is negative for acute cardiopulmonary disease. Chemistry panels notable for a serum sodium of 124 and chloride of 89. CBC features a mild normocytic anemia with hemoglobin of 11.8. INR is within the normal limits, troponin is undetectable, and urinalysis is unremarkable. Patient was given a liter of normal  saline in the ED as well as Zofran. Digoxin level was ordered and remains pending. Patient remained slightly bradycardic in the ED, but otherwise stable. She'll be observed on the telemetry unit for ongoing evaluation and management of malaise with hyponatremia.  Review of Systems:  Unable to obtain complete ROS secondary to the patient's clinical condition.  Past Medical History:  Diagnosis Date  . AF (atrial fibrillation) ([place])   . Cancer (HCC)    breast  . Chest pain   . Constipation   . Heart palpitations    related for flecainide and due to having afib   . Hyperlipidemia     Past Surgical History:  Procedure Laterality Date  . ABDOMINAL HYSTERECTOMY    . APPENDECTOMY    . BREAST BIOPSY    . CARDIAC CATHETERIZATION  08/2008   NORMAL CORONARY ARTERIES     reports that she quit smoking about 33 years ago. She has never used smokeless tobacco. She reports that she does not drink alcohol or use drugs.  Allergies  Allergen Reactions  . Meperidine     Other reaction(s): nausea  . Midazolam     Other reaction(s): nausea  . Pravastatin Other (See Comments)    Memory Issues  . Propafenone Hcl Er Other (See Comments)    unknown  . Sulfa Antibiotics     Other reaction(s): nausea   . Penicillins Rash    Family History  Problem Relation Age of Onset  . Cancer Mother        Brain  . Cancer Sister        Breast  . Cancer - Other Brother      Prior to Admission medications  Medication Sig Start Date End Date Taking? Authorizing Provider  Calcium Citrate-Vitamin D (CALCIUM CITRATE + D PO) Take 1 tablet by mouth 2 (two) times daily.   Yes [provider]  [place] 125 MCG tablet TAKE 1 TABLET(125 MCG) BY MOUTH DAILY 12/26/15  Yes Nahser, Wonda Cheng, MD  docusate sodium (COLACE) 100 MG capsule Take 100 mg by mouth 2 (two) times daily.   Yes [provider]  ELIQUIS 5 MG TABS tablet TAKE 1 TABLET BY MOUTH TWICE DAILY 12/26/15  Yes Nahser, Wonda Cheng, MD    flecainide (TAMBOCOR) 100 MG tablet TAKE 1 TABLET BY MOUTH TWICE DAILY 12/26/15  Yes Nahser, Wonda Cheng, MD  hydroxypropyl methylcellulose / hypromellose (ISOPTO TEARS / GONIOVISC) 2.5 % ophthalmic solution Place 1 drop into both eyes 3 (three) times daily as needed for dry eyes.   Yes [provider]  LORazepam (ATIVAN) 0.5 MG tablet Take 0.25-0.5 mg by mouth every 6 (six) hours as needed for anxiety.  12/22/12  Yes [provider]  metoprolol succinate (TOPROL XL) 25 MG 24 hr tablet Take 1 tablet (25 mg total) by mouth daily. 04/10/16  Yes Nahser, Wonda Cheng, MD  mirtazapine (REMERON) 15 MG tablet Take 1 tablet at night 08/31/16  Yes Cameron Sprang, MD  Multiple Vitamin (MULTIVITAMIN) tablet Take 1 tablet by mouth every evening.    Yes [provider]  Pimavanserin Tartrate (NUPLAZID) 17 MG TABS Take 34 mg by mouth daily. 08/27/16  Yes Cameron Sprang, MD    Physical Exam: Vitals:   09/03/16 0400 09/03/16 0417 09/03/16 0430 09/03/16 0500  BP: (!) 122/52  (!) 157/57 (!) 162/60  Pulse: (!) 51 (!) 51 (!) 53 (!) 53  Resp: 11 19 (!) 25 13  Temp:      TempSrc:      SpO2: 99% 100% 100% 100%  Weight:      Height:          Constitutional: No acute distress. Somnolent but easily roused.   Eyes: PERTLA, lids and conjunctivae normal ENMT: Mucous membranes are moist. Posterior pharynx clear of any exudate or lesions.   Neck: normal, supple, no masses, no thyromegaly Respiratory: clear to auscultation bilaterally, no wheezing, no crackles. Normal respiratory effort.   Cardiovascular: Rate ~50 and regular. Trace pretibial edema. No significant JVD. Abdomen: No distension, no tenderness, no masses palpated. Bowel sounds normal.  Musculoskeletal: no clubbing / cyanosis. No joint deformity upper and lower extremities.   Skin: no significant rashes, lesions, ulcers. Warm, dry, well-perfused. Neurologic: CN 2-12 grossly intact. Sensation intact, patellar DTR normal.    Psychiatric: Somnolent, very little verbal communication.      Labs on Admission: I have personally reviewed following labs and imaging studies  CBC:  Recent Labs Lab 09/02/16 2220  WBC 10.3  HGB 11.8*  HCT 33.9*  MCV 90.6  PLT 315   Basic Metabolic Panel:  Recent Labs Lab 09/02/16 2220  NA 124*  K 3.7  CL 89*  CO2 28  GLUCOSE 153*  BUN 20  CREATININE 0.85  CALCIUM 9.4   GFR: Estimated Creatinine Clearance: 48 mL/min (by C-G formula based on SCr of 0.85 mg/dL). Liver Function Tests: No results for input(s): AST, ALT, ALKPHOS, BILITOT, PROT, ALBUMIN in the last 168 hours. No results for input(s): LIPASE, AMYLASE in the last 168 hours. No results for input(s): AMMONIA in the last 168 hours. Coagulation Profile:  Recent Labs Lab 09/02/16 2220  INR 1.18   Cardiac Enzymes: No  results for input(s): CKTOTAL, CKMB, CKMBINDEX, TROPONINI in the last 168 hours. BNP (last 3 results) No results for input(s): PROBNP in the last 8760 hours. HbA1C: No results for input(s): HGBA1C in the last 72 hours. CBG: No results for input(s): GLUCAP in the last 168 hours. Lipid Profile: No results for input(s): CHOL, HDL, LDLCALC, TRIG, CHOLHDL, LDLDIRECT in the last 72 hours. Thyroid Function Tests: No results for input(s): TSH, T4TOTAL, FREET4, T3FREE, THYROIDAB in the last 72 hours. Anemia Panel: No results for input(s): VITAMINB12, FOLATE, FERRITIN, TIBC, IRON, RETICCTPCT in the last 72 hours. Urine analysis:    Component Value Date/Time   COLORURINE YELLOW 09/03/2016 0230   APPEARANCEUR CLEAR 09/03/2016 0230   LABSPEC 1.009 09/03/2016 0230   PHURINE 6.0 09/03/2016 0230   GLUCOSEU NEGATIVE 09/03/2016 0230   HGBUR NEGATIVE 09/03/2016 0230   BILIRUBINUR NEGATIVE 09/03/2016 0230   KETONESUR NEGATIVE 09/03/2016 0230   PROTEINUR NEGATIVE 09/03/2016 0230   NITRITE NEGATIVE 09/03/2016 0230   LEUKOCYTESUR NEGATIVE 09/03/2016 0230   Sepsis  Labs: @LABRCNTIP (procalcitonin:4,lacticidven:4) )No results found for this or any previous visit (from the past 240 hour(s)).   Radiological Exams on Admission: Dg Chest 2 View  Result Date: 09/02/2016 CLINICAL DATA:  Hypertension and nausea EXAM: CHEST  2 VIEW COMPARISON:  02/26/2015, CT 03/04/2015 FINDINGS: No acute pulmonary infiltrate or effusion is seen. A focal opacity at the left base could relate to eventration of the diaphragm. Heart size within normal limits. Mediastinal contour unremarkable. No pneumothorax. IMPRESSION: No active cardiopulmonary disease. Electronically Signed   By: Donavan Foil M.D.   On: 09/02/2016 23:42   Ct Head Wo Contrast  Result Date: 09/03/2016 CLINICAL DATA:  [age]-year-old female with altered mental status. History of breast cancer. EXAM: CT HEAD WITHOUT CONTRAST TECHNIQUE: Contiguous axial images were obtained from the base of the skull through the vertex without intravenous contrast. COMPARISON:  Brain MRI dated 03/08/2015 FINDINGS: Brain: There is moderate age-related atrophy and chronic microvascular ischemic changes. No acute intracranial hemorrhage. No mass effect or midline shift. No extra-axial fluid collection. Vascular: No hyperdense vessel or unexpected calcification. Skull: Normal. Negative for fracture or focal lesion. Sinuses/Orbits: Mild mucoperiosteal thickening and partial opacification of the left sphenoid sinus. No air-fluid level. Other: None IMPRESSION: 1. No acute intracranial pathology. 2. Moderate age-related atrophy and chronic microvascular ischemic changes. Electronically Signed   By: Anner Crete M.D.   On: 09/03/2016 02:46    EKG: Independently reviewed. Sinus bradycardia (rate 52), 1st deg AV block, LVH by voltage criteria with secondary repolarization abnormality.  Assessment/Plan  1. Hyponatremia  - Serum sodium is 124 on admission; no recent chem panels available for comparison  - Pt appears euvolemic; no recent vomiting  or diarrhea, no change in appetite, no increase water intake, no diuretics  - Head CT unremarkable and no focal neurologic deficits identified  - She was treated with 1 liter NS in ED  - Etiology not yet clear; plan to SLIV for now, check TSH, serum and urine osmolality, and urine sodium  - Repeat chem panel in several hours    2. Sinus bradycardia  - HR reportedly in 40's at home just PTA, in low 50's in ED  - Troponin undetectable  - Likely secondary to Toprol vs autonomic dysfunction in setting of Lewy body dementia  - Hold Toprol for now and monitor on telemetry  - Check TSH as above    3. Paroxysmal atrial fibrillation  - In a sinus rhythm on admission  -  CHADS-VASc 4 (age x2, gender, HTN) - Continue Eliquis, flecainide, and digoxin  4. Hypertension - BP slightly elevation in ED  - Plan to hold Toprol initially as above  - Treat with hydralazine IVP's prn    5. Lewy body dementia  - Recent diagnosis, follows with neurology  - Recently started on Nuplazid and Remeron  - Plan to continue Remeron; Nuplazid not available    DVT prophylaxis: Eliquis  Code Status: Full  Family Communication: Daughter and husband updated at bedside Disposition Plan: Observe on telemetry Consults called: None Admission status: Observation    Vianne Bulls, MD [facility] Pager [phone]  If 7PM-7AM, please contact night-coverage [email or web address] Password [facility]  09/03/2016, 5:37 AM

## 2016-09-04 DIAGNOSIS — G3183 Dementia with Lewy bodies: Secondary | ICD-10-CM | POA: Diagnosis not present

## 2016-09-04 DIAGNOSIS — F0281 Dementia in other diseases classified elsewhere with behavioral disturbance: Secondary | ICD-10-CM | POA: Diagnosis not present

## 2016-09-04 DIAGNOSIS — R001 Bradycardia, unspecified: Secondary | ICD-10-CM

## 2016-09-04 DIAGNOSIS — E871 Hypo-osmolality and hyponatremia: Secondary | ICD-10-CM | POA: Diagnosis not present

## 2016-09-04 LAB — BASIC METABOLIC PANEL
Anion gap: 8 (ref 5–15)
BUN: 12 mg/dL (ref 6–20)
CO2: 26 mmol/L (ref 22–32)
Calcium: 8.9 mg/dL (ref 8.9–10.3)
Chloride: 99 mmol/L — ABNORMAL LOW (ref 101–111)
Creatinine, Ser: 0.73 mg/dL (ref 0.44–1.00)
GFR calc Af Amer: >60 mL/min (ref >60)
GFR calc non Af Amer: >60 mL/min (ref >60)
GLUCOSE: 95 mg/dL (ref 65–99)
POTASSIUM: 3.9 mmol/L (ref 3.5–5.1)
Sodium: 133 mmol/L — ABNORMAL LOW (ref 135–145)

## 2016-09-04 LAB — GLUCOSE, CAPILLARY
GLUCOSE-CAPILLARY: 96 mg/dL (ref 65–99)
Glucose-Capillary: 99 mg/dL (ref 65–99)
Glucose-Capillary: 103 mg/dL — ABNORMAL HIGH (ref 65–99)

## 2016-09-04 MED ORDER — METOPROLOL SUCCINATE ER 25 MG PO TB24: 12.5000 mg | ORAL_TABLET | Freq: Every day | ORAL | 11 refills | Status: DC

## 2016-09-04 MED ORDER — TRAZODONE HCL 50 MG PO TABS: 25.0000 mg | ORAL_TABLET | Freq: Every day | ORAL | Status: DC

## 2016-09-04 MED ORDER — TRAZODONE HCL 50 MG PO TABS: 25.0000 mg | ORAL_TABLET | Freq: Every day | ORAL | 0 refills | Status: DC

## 2016-09-04 NOTE — Care Management Note (Signed)
Case Management Note  Patient Details  Name: Candice Schultz MRN: [id number] Date of Birth: 1935/02/10  Subjective/Objective:          Patient presented to [facility] ED for c/o malaise and generalized weakness          Action/Plan: Patient is being discharged home today with recommendations for [facility] PT/OT family is agreeable with recommendations. Offered choice, [facility] selected, patient's husband is also receiving [place] with [facility], DME needed r/w 3n1 chair patient will receive prior to discharge today. Referral faxed in to [facility] received fax confirmations. Patient and family are agreeable with transitional care plan [facility] services, no further questions or concerns verbalized. No further CM needs identified   Expected Discharge Date:  09/04/16               Expected Discharge Plan:  [place]  In-House Referral:     Discharge planning Services  CM Consult  Post Acute Care Choice:  Durable Medical Equipment, Home Health Choice offered to:  Patient, Adult Children  DME Arranged:  3-N-1, [place] rolling DME Agency:  [place] Arranged:  PT, OT [place] Agency:  [facility] (now [facility])  Status of Service:  Completed, signed off  If discussed at [employer] of Stay Meetings, dates discussed:    Additional CommentsLaurena Slimmer, RN 09/04/2016, 11:02 PM

## 2016-09-04 NOTE — Progress Notes (Signed)
Candice Schultz to be D/C'd Home per MD order.  Discussed with the patient and all questions fully answered.  VSS, Skin clean, dry and intact without evidence of skin break down, no evidence of skin tears noted. IV catheter discontinued intact. Site without signs and symptoms of complications. Dressing and pressure applied.  An After Visit Summary was printed and given to the patient. Patient received prescription.  D/c education completed with patient/family including follow up instructions, medication list, d/c activities limitations if indicated, with other d/c instructions as indicated by MD - patient able to verbalize understanding, all questions fully answered.   Patient instructed to return to ED, call 911, or call MD for any changes in condition.   Patient escorted via [place], and D/C home via private auto.  Karolee Ohs 09/04/2016 7:07 PM h

## 2016-09-04 NOTE — Care Management Obs Status (Signed)
[place] NOTIFICATION   Patient Details  Name: CHERRISE OCCHIPINTI MRN: [id number] Date of Birth: Jul 26, 1934   Medicare Observation Status Notification Given:  Ernesta Amble, RN 09/04/2016, 5:51 PM

## 2016-09-04 NOTE — Discharge Summary (Addendum)
Physician Discharge Summary  Candice Schultz FAO:[id number] DOB: 13-May-1934 DOA: 09/03/2016  PCP: Lavone Orn, MD  Admit date: 09/03/2016 Discharge date: 09/04/2016   Recommendations for Outpatient Follow-Up:   BMP 1 week re NA remeron was d/c'd and trazodone started  Discharge Diagnosis:   Principal Problem:   Hyponatremia Active Problems:   Atrial fibrillation (HCC)   Mild dementia   Lewy body dementia with behavioral disturbance   Sinus bradycardia on ECG   Discharge disposition:  Home.  Discharge Condition: Improved.  Diet recommendation: Low sodium, heart healthy  Wound care: None.   History of Present Illness:   Candice Schultz is a 82 y.o. female with medical history significant for atrial fibrillation on Eliquis, Lewy body dementia followed by neurology, and hypertension, now presenting to the emergency department for evaluation of malaise, hypertension, and bradycardia. She is accompanied by her husband and daughter who assist with the history. She has reportedly been experiencing increasing problems related to her Lewy body dementia and was started on Nuplazid and Remeron recently by her neurologist. She had otherwise been doing well until complaining of a general feeling of unwellness last night. She has seemed to be a little more lethargic and her daughter has been monitoring her closely, checking her CBG, blood pressure, and pulse rates frequently overnight. CBG was normal, but she noted increasing systolic blood pressures with lower than usual DBP and pulse rate as low as 42. Eventually, this prompted her to call 911 for transport to the hospital. Patient is unable to contribute to the history secondary to her clinical condition with dementia.   Hospital Course by Problem:   Hyponatremia  - Serum sodium was 124 on admission; no recent chem panels available for comparison  - Pt appears euvolemic; no recent vomiting or diarrhea, no change in appetite, no increase water  intake, no diuretics  - Head CT unremarkable and no focal neurologic deficits identified  - suspect due to recent addition of remeron -improved -follow outpatient   Sinus bradycardia  - HR reportedly in 40's at home just PTA, in low 50's in ED  - Troponin undetectable  -monitor as outpatient-- have decreased BB   Paroxysmal atrial fibrillation  - In a sinus rhythm on admission  - CHADS-VASc 4 (age x2, gender, HTN) - Continue Eliquis, flecainide, and digoxin   Hypertension - resume home med    Lewy body dementia  - Recent diagnosis, follows with neurology  - Recently started on Nuplazid and Remeron  - d/c remeron in thought that this caused the hyponatremia    Medical Consultants:    None.   Discharge Exam:   Vitals:   09/04/16 0802 09/04/16 1141  BP: (!) 161/67 131/77  Pulse: 65 (!) 59  Resp: 16 16  Temp: 98.1 F (36.7 C) 97.7 F (36.5 C)   Vitals:   09/03/16 2200 09/04/16 0645 09/04/16 0802 09/04/16 1141  BP: (!) 179/68 (!) 150/66 (!) 161/67 131/77  Pulse: 65 64 65 (!) 59  Resp: 14 16 16 16   Temp: 98.1 F (36.7 C) 98 F (36.7 C) 98.1 F (36.7 C) 97.7 F (36.5 C)  TempSrc: Oral Oral Oral Oral  SpO2: 98% 97% 100% 100%  Weight:  54.7 kg (120 lb 9.6 oz)    Height:        Gen:  NAD    The results of significant diagnostics from this hospitalization (including imaging, microbiology, ancillary and laboratory) are listed below for reference.     Procedures  and Diagnostic Studies:   Ct Head Wo Contrast  Result Date: 09/03/2016 CLINICAL DATA:  [age]-year-old female with altered mental status. History of breast cancer. EXAM: CT HEAD WITHOUT CONTRAST TECHNIQUE: Contiguous axial images were obtained from the base of the skull through the vertex without intravenous contrast. COMPARISON:  Brain MRI dated 03/08/2015 FINDINGS: Brain: There is moderate age-related atrophy and chronic microvascular ischemic changes. No acute intracranial hemorrhage. No mass  effect or midline shift. No extra-axial fluid collection. Vascular: No hyperdense vessel or unexpected calcification. Skull: Normal. Negative for fracture or focal lesion. Sinuses/Orbits: Mild mucoperiosteal thickening and partial opacification of the left sphenoid sinus. No air-fluid level. Other: None IMPRESSION: 1. No acute intracranial pathology. 2. Moderate age-related atrophy and chronic microvascular ischemic changes. Electronically Signed   By: Anner Crete M.D.   On: 09/03/2016 02:46     Labs:   Basic Metabolic Panel:  Recent Labs Lab 09/02/16 2220 09/03/16 1139 09/04/16 0618  NA 124* 127* 133*  K 3.7 3.6 3.9  CL 89* 93* 99*  CO2 28 28 26   GLUCOSE 153* 103* 95  BUN 20 14 12   CREATININE 0.85 0.78 0.73  CALCIUM 9.4 8.4* 8.9   GFR Estimated Creatinine Clearance: 49.2 mL/min (by C-G formula based on SCr of 0.73 mg/dL). Liver Function Tests:  Recent Labs Lab 09/03/16 0608  AST 19  ALT 17  ALKPHOS 43  BILITOT 1.0  PROT 6.2*  ALBUMIN 3.7   No results for input(s): LIPASE, AMYLASE in the last 168 hours. No results for input(s): AMMONIA in the last 168 hours. Coagulation profile  Recent Labs Lab 09/02/16 2220  INR 1.18    CBC:  Recent Labs Lab 09/02/16 2220  WBC 10.3  HGB 11.8*  HCT 33.9*  MCV 90.6  PLT 343   Cardiac Enzymes: No results for input(s): CKTOTAL, CKMB, CKMBINDEX, TROPONINI in the last 168 hours. BNP: Invalid input(s): POCBNP CBG:  Recent Labs Lab 09/03/16 1206 09/03/16 1653 09/03/16 2148 09/04/16 0809 09/04/16 1140  GLUCAP 93 108* 108* 99 96   D-Dimer No results for input(s): DDIMER in the last 72 hours. Hgb A1c No results for input(s): HGBA1C in the last 72 hours. Lipid Profile No results for input(s): CHOL, HDL, LDLCALC, TRIG, CHOLHDL, LDLDIRECT in the last 72 hours. Thyroid function studies  Recent Labs  09/03/16 0608  TSH 1.323   Anemia work up No results for input(s): VITAMINB12, FOLATE, FERRITIN, TIBC, IRON,  RETICCTPCT in the last 72 hours. Microbiology No results found for this or any previous visit (from the past 240 hour(s)).   Discharge Instructions:    Allergies as of 09/04/2016      Reactions   Meperidine    Other reaction(s): nausea   Midazolam    Other reaction(s): nausea   Pravastatin Other (See Comments)   Memory Issues   Propafenone Hcl Er Other (See Comments)   unknown   Sulfa Antibiotics    Other reaction(s): nausea    Penicillins Rash      Medication List    STOP taking these medications   mirtazapine 15 MG tablet Commonly known as:  REMERON     TAKE these medications   CALCIUM CITRATE + D PO Take 1 tablet by mouth 2 (two) times daily.   DIGOX 0.125 MG tablet Generic drug:  digoxin TAKE 1 TABLET(125 MCG) BY MOUTH DAILY   docusate sodium 100 MG capsule Commonly known as:  COLACE Take 100 mg by mouth 2 (two) times daily.   ELIQUIS  5 MG Tabs tablet Generic drug:  apixaban TAKE 1 TABLET BY MOUTH TWICE DAILY   flecainide 100 MG tablet Commonly known as:  TAMBOCOR TAKE 1 TABLET BY MOUTH TWICE DAILY   hydroxypropyl methylcellulose / hypromellose 2.5 % ophthalmic solution Commonly known as:  ISOPTO TEARS / GONIOVISC Place 1 drop into both eyes 3 (three) times daily as needed for dry eyes.   LORazepam 0.5 MG tablet Commonly known as:  ATIVAN Take 0.25-0.5 mg by mouth every 6 (six) hours as needed for anxiety.   metoprolol succinate 25 MG 24 hr tablet Commonly known as:  TOPROL XL Take 0.5 tablets (12.5 mg total) by mouth daily. What changed:  how much to take   multivitamin tablet Take 1 tablet by mouth every evening.   Pimavanserin Tartrate 17 MG Tabs Commonly known as:  NUPLAZID Take 34 mg by mouth daily.   traZODone 50 MG tablet Commonly known as:  DESYREL Take 0.5 tablets (25 mg total) by mouth at bedtime.      Follow-up Information    Lavone Orn, MD Follow up in 1 week(s).   Specialty:  Internal Medicine Contact information: [address] [place] [address] [phone]            Time coordinating discharge: 35 min  Signed:  Karcyn Menn Alison Stalling   [facility] 09/04/2016, 3:07 PM

## 2016-09-04 NOTE — Evaluation (Signed)
Physical Therapy Evaluation Patient Details Name: Candice Schultz MRN: [id number] DOB: 03/03/1934 Today's Date: 09/04/2016   History of Present Illness  Candice Schultz is a 82 y.o. female with medical history significant for atrial fibrillation on Eliquis, Lewy body dementia followed by neurology, and hypertension, now presenting to the emergency department for evaluation of malaise, hypertension, and bradycardia.  Clinical Impression  Pt admitted with above diagnosis. Pt currently with functional limitations due to the deficits listed below (see PT Problem List). Pt will benefit from skilled PT to increase their independence and safety with mobility to allow discharge to the venue listed below.  Pt's daughter is primarily concerned with pt's gait and R UE tremor which has recently impacted her ability to do her ADLS.  Pt did well with RW once she got going with it.  Due to dementia, unsure if she would remember to use it, but she is safer with it.  Recommend RW, 3-1 BSC and HHPT, as well as continued 24 hour S.     Follow Up Recommendations Home health PT;Supervision/Assistance - 24 hour;Supervision for mobility/OOB    Equipment Recommendations  Rolling walker with 5" wheels;3in1 (PT)    Recommendations for Other Services OT consult     Precautions / Restrictions Precautions Precautions: Fall Restrictions Weight Bearing Restrictions: No      Mobility  Bed Mobility Overal bed mobility: Modified Independent                Transfers Overall transfer level: Needs assistance   Transfers: Sit to/from Stand Sit to Stand: Supervision         General transfer comment: S for safety only  Ambulation/Gait Ambulation/Gait assistance: Min assist;Min guard Ambulation Distance (Feet): 130 Feet Assistive device: 1 person hand held assist;Rolling walker (2 wheeled);None Gait Pattern/deviations: Decreased step length - right;Decreased step length - left;Shuffle Gait velocity:  decreased   General Gait Details: Shuffled gait pattern without UE support and not safe, so offered 1 hand which did improve steadiness.  In hallway, ambulated with RW with min/guard to MIN A.  Some difficulty initially with use of RW, but once she got a smooth cadence, she did fairly well.  Stairs            Wheelchair Mobility    Modified Rankin (Stroke Patients Only)       Balance Overall balance assessment: Needs assistance   Sitting balance-Leahy Scale: Good       Standing balance-Leahy Scale: Poor Standing balance comment: unsteady in unsupported standing                             Pertinent Vitals/Pain Pain Assessment: No/denies pain    Home Living Family/patient expects to be discharged to:: Private residence Living Arrangements: Spouse/significant other Available Help at Discharge: Family;Available 24 hours/day Type of Home: House Home Access: Stairs to enter     Home Layout: Two level;Able to live on main level with bedroom/bathroom Home Equipment: None Additional Comments: daughter reports that she or her husband are their with pt and pt's husband during the day.  They set them up for bed and then go home for the night. Pt's husband is with her over night and can call them if they need anything, although it appears, husband could not help physically    Prior Function Level of Independence: Needs assistance   Gait / Transfers Assistance Needed: Pt has been independent with gait, but family noticing more  unsteadiness and trepidation with gait  ADL's / Homemaking Assistance Needed: Recent increase in difficulty using R hand due to tremor affecting ADLs.        Hand Dominance        Extremity/Trunk Assessment   Upper Extremity Assessment Upper Extremity Assessment: Overall WFL for tasks assessed    Lower Extremity Assessment Lower Extremity Assessment: Overall WFL for tasks assessed       Communication   Communication: No  difficulties  Cognition Arousal/Alertness: Awake/alert   Overall Cognitive Status: History of cognitive impairments - at baseline                                 General Comments: hx of Lewy Body dementia.  Pt oriented to person and city.      General Comments      Exercises     Assessment/Plan    PT Assessment Patient needs continued PT services  PT Problem List Decreased activity tolerance;Decreased balance;Decreased mobility;Decreased knowledge of use of DME;Decreased safety awareness       PT Treatment Interventions DME instruction;Gait training;Stair training;Functional mobility training;Therapeutic activities;Therapeutic exercise;Patient/family education    PT Goals (Current goals can be found in the Care Plan section)  Acute Rehab PT Goals Patient Stated Goal: return home and for her to be able to use her R arm better. PT Goal Formulation: With family Time For Goal Achievement: 09/18/16 Potential to Achieve Goals: Fair    Frequency Min 3X/week   Barriers to discharge        Co-evaluation               AM-PAC PT "6 Clicks" Daily Activity  Outcome Measure Difficulty turning over in bed (including adjusting bedclothes, sheets and blankets)?: None Difficulty moving from lying on back to sitting on the side of the bed? : None Difficulty sitting down on and standing up from a chair with arms (e.g., wheelchair, bedside commode, etc,.)?: A Little Help needed moving to and from a bed to chair (including a wheelchair)?: A Little Help needed walking in hospital room?: A Little Help needed climbing 3-5 steps with a railing? : A Little 6 Click Score: 20    End of Session Equipment Utilized During Treatment: Gait belt Activity Tolerance: Patient tolerated treatment well Patient left: in chair;with call bell/phone within reach;with chair alarm set Nurse Communication: Mobility status PT Visit Diagnosis: Unsteadiness on feet (R26.81);Difficulty in  walking, not elsewhere classified (R26.2)    Time: 7619-5093 PT Time Calculation (min) (ACUTE ONLY): 26 min   Charges:   PT Evaluation $PT Eval Moderate Complexity: 1 Procedure PT Treatments $Gait Training: 8-22 mins   PT G Codes:   PT G-Codes **NOT FOR INPATIENT CLASS** Functional Assessment Tool Used: AM-PAC 6 Clicks Basic Mobility Functional Limitation: Mobility: Walking and moving around Mobility: Walking and Moving Around Current Status (O6712): At least 20 percent but less than 40 percent impaired, limited or restricted Mobility: Walking and Moving Around Goal Status [phone]): At least 1 percent but less than 20 percent impaired, limited or restricted    Santiago Glad L. Tamala Julian, [place] Pager [phone] 09/04/2016   Galen Manila 09/04/2016, 11:23 AM

## 2016-09-05 ENCOUNTER — Telehealth: Payer: Self-pay | Admitting: Neurology

## 2016-09-05 NOTE — Telephone Encounter (Signed)
Pt's daughter called and said the specialty pharm that is filling Nuplazid for her mother needs more info before they can fill it.  She said someone from the office talked to this pharmacy on Monday.  Please call the pharm so this med can be filled and mail out to them before the patient runs out.

## 2016-09-30 ENCOUNTER — Other Ambulatory Visit: Payer: Self-pay | Admitting: Cardiovascular Disease

## 2016-10-12 ENCOUNTER — Telehealth: Payer: Self-pay | Admitting: Neurology

## 2016-10-12 NOTE — Telephone Encounter (Signed)
Patient's daughter [name] called needing a letter written by Dr. Delice Lesch regarding her parents. She said her mom was unable to write a check today, she tried 3 times. She finally said I cannot do it and that she needed to rest. She also said her dad's License have been restricted but he is still driving in the dark and getting lost. The Sheriff has had to pull him over. She said the bank is needing a letter stating from Dr. Delice Lesch that they cannot handle their affairs along with the diagnosis and a statement.  Please Advise. Thanks

## 2016-10-15 ENCOUNTER — Encounter: Payer: Self-pay | Admitting: Neurology

## 2016-10-15 NOTE — Telephone Encounter (Signed)
Letter done, thanks.

## 2016-10-15 NOTE — Telephone Encounter (Signed)
Daughter will stop by tomorrow to pick up letter

## 2016-10-25 ENCOUNTER — Other Ambulatory Visit: Payer: Self-pay | Admitting: Cardiovascular Disease

## 2016-10-26 NOTE — Telephone Encounter (Signed)
Age 82, Scr 0.7, wt <60kg

## 2016-11-02 ENCOUNTER — Emergency Department (HOSPITAL_COMMUNITY): Payer: Medicare Other

## 2016-11-02 ENCOUNTER — Encounter (HOSPITAL_COMMUNITY): Payer: Self-pay | Admitting: Emergency Medicine

## 2016-11-02 ENCOUNTER — Emergency Department (HOSPITAL_COMMUNITY)
Admission: EM | Admit: 2016-11-02 | Discharge: 2016-11-02 | Disposition: A | Discharge status: Home / Self Care | Payer: Medicare Other | Attending: Emergency Medicine | Admitting: Emergency Medicine

## 2016-11-02 DIAGNOSIS — Z853 Personal history of malignant neoplasm of breast: Secondary | ICD-10-CM | POA: Diagnosis not present

## 2016-11-02 DIAGNOSIS — Z87891 Personal history of nicotine dependence: Secondary | ICD-10-CM | POA: Insufficient documentation

## 2016-11-02 DIAGNOSIS — R0789 Other chest pain: Secondary | ICD-10-CM | POA: Diagnosis not present

## 2016-11-02 DIAGNOSIS — Y92009 Unspecified place in unspecified non-institutional (private) residence as the place of occurrence of the external cause: Secondary | ICD-10-CM | POA: Diagnosis not present

## 2016-11-02 DIAGNOSIS — Z7901 Long term (current) use of anticoagulants: Secondary | ICD-10-CM | POA: Diagnosis not present

## 2016-11-02 DIAGNOSIS — S0990XA Unspecified injury of head, initial encounter: Secondary | ICD-10-CM | POA: Diagnosis not present

## 2016-11-02 DIAGNOSIS — M542 Cervicalgia: Secondary | ICD-10-CM | POA: Insufficient documentation

## 2016-11-02 DIAGNOSIS — W0110XA Fall on same level from slipping, tripping and stumbling with subsequent striking against unspecified object, initial encounter: Secondary | ICD-10-CM | POA: Diagnosis not present

## 2016-11-02 DIAGNOSIS — Y9389 Activity, other specified: Secondary | ICD-10-CM | POA: Diagnosis not present

## 2016-11-02 DIAGNOSIS — Y998 Other external cause status: Secondary | ICD-10-CM | POA: Diagnosis not present

## 2016-11-02 DIAGNOSIS — W19XXXA Unspecified fall, initial encounter: Secondary | ICD-10-CM

## 2016-11-02 DIAGNOSIS — Z79899 Other long term (current) drug therapy: Secondary | ICD-10-CM | POA: Diagnosis not present

## 2016-11-02 DIAGNOSIS — I10 Essential (primary) hypertension: Secondary | ICD-10-CM | POA: Diagnosis not present

## 2016-11-02 MED ORDER — ACETAMINOPHEN 325 MG PO TABS
650.0000 mg | ORAL_TABLET | Freq: Once | ORAL | Status: AC
  Administered 2016-11-02: 650 mg via ORAL
  Filled 2016-11-02: qty 2

## 2016-11-02 NOTE — ED Notes (Signed)
Pt ambulated with stand by assistance to bathroom. Gait normal for baseline.

## 2016-11-02 NOTE — ED Triage Notes (Signed)
Patient arrived to ED via [facility]. EMS reports:  Patient lives at home with spouse. Patient experienced unwitnessed fall at home. Posterior head struck nightstand. C/o posterior head pain and cervical neck pain. EMS placed in C-collar. No neuro deficits noted. No LOC, shortness of breath, nausea, vomiting.   Patient has dementia. Clear lungs. Patient takes Eliquis for A Fib. Hx AF, HTN. BP 173/73, Pulse 60s-70s, Resp 16, 98% on room air. CBG 161.

## 2016-11-02 NOTE — ED Provider Notes (Signed)
[facility] DEPT Provider Note   CSN: [id number] Arrival date & time: 11/02/16  1516     History   Chief Complaint Chief Complaint  Patient presents with  . Fall    HPI Candice Schultz is a 82 y.o. female presenting Via EMS with unwitnessed slip and fall. She reports that she was trying to put something on her bed when her foot slipped and she fell on her back and hit the back of her head. Denies any prodrome prior to fall. No chest pain, dizziness, shortness of breath or fainting. She is currently on eliquis for A. Fib. She is complaining of neck pain, head pain, chest discomfort and upper back pain. Denies dizziness, blurred vision, nausea, vomiting, numbness, weakness.  HPI  Past Medical History:  Diagnosis Date  . AF (atrial fibrillation) ([place])   . Anxiety   . Cancer ([facility])    breast  . Chest pain   . Constipation   . Dementia    mild  . Family history of adverse reaction to anesthesia    " my daughter"  . Heart palpitations    related for flecainide and due to having afib   . Hyperlipidemia   . Hypertension     Patient Active Problem List   Diagnosis Date Noted  . Hyponatremia 09/03/2016  . Sinus bradycardia on ECG 09/03/2016  . Bradycardia   . Essential hypertension   . Lewy body dementia with behavioral disturbance 08/15/2016  . Mitral valve prolapse 10/25/2015  . Mild dementia 02/25/2015  . Encounter for therapeutic drug monitoring 04/24/2013  . History of breast cancer 12/11/2010  . Chest pain   . Hyperlipidemia   . Atrial fibrillation ([place]) 06/06/2010  . Long term current use of anticoagulant 06/06/2010    Past Surgical History:  Procedure Laterality Date  . ABDOMINAL HYSTERECTOMY    . APPENDECTOMY    . BREAST BIOPSY    . CARDIAC CATHETERIZATION  08/2008   NORMAL CORONARY ARTERIES    OB History    No data available       Home Medications    Prior to Admission medications   Medication Sig Start Date End Date Taking? Authorizing Provider    acetaminophen (TYLENOL) 500 MG tablet Take 500 mg by mouth every 6 (six) hours as needed for headache.   Yes [provider]  Calcium Citrate-Vitamin D (CALCIUM CITRATE + D PO) Take 1 tablet by mouth 2 (two) times daily.   Yes [provider]  [place] 125 MCG tablet TAKE 1 TABLET BY MOUTH EVERY DAY Patient taking differently: TAKE 125MG  BY MOUTH EVERY DAY 10/01/16  Yes Nahser, Wonda Cheng, MD  docusate sodium (COLACE) 100 MG capsule Take 100 mg by mouth 2 (two) times daily.   Yes [provider]  ELIQUIS 5 MG TABS tablet TAKE 1 TABLET BY MOUTH TWICE DAILY Patient taking differently: TAKE 5MG  BY MOUTH TWICE DAILY 10/26/16  Yes Nahser, Wonda Cheng, MD  flecainide (TAMBOCOR) 100 MG tablet TAKE 1 TABLET BY MOUTH TWICE DAILY Patient taking differently: TAKE 100MG  BY MOUTH TWICE DAILY 10/01/16  Yes Nahser, Wonda Cheng, MD  hydroxypropyl methylcellulose / hypromellose (ISOPTO TEARS / GONIOVISC) 2.5 % ophthalmic solution Place 1 drop into both eyes 3 (three) times daily as needed for dry eyes.   Yes [provider]  LORazepam (ATIVAN) 0.5 MG tablet Take 0.25-0.5 mg by mouth every 6 (six) hours as needed for anxiety.  12/22/12  Yes [provider]  metoprolol succinate (TOPROL XL)  25 MG 24 hr tablet Take 0.5 tablets (12.5 mg total) by mouth daily. Patient taking differently: Take 25 mg by mouth daily.  09/04/16  Yes Geradine Girt, DO  Multiple Vitamin (MULTIVITAMIN) tablet Take 1 tablet by mouth daily.    Yes [provider]  Pimavanserin Tartrate (NUPLAZID) 17 MG TABS Take 34 mg by mouth daily. 08/27/16  Yes Cameron Sprang, MD  traZODone (DESYREL) 50 MG tablet Take 0.5 tablets (25 mg total) by mouth at bedtime. Patient taking differently: Take 50 mg by mouth at bedtime.  09/04/16  Yes Geradine Girt, DO    Family History Family History  Problem Relation Age of Onset  . Cancer Mother        Brain  . Cancer Sister        Breast  . Cancer - Other Brother      Social History Social History  Substance Use Topics  . Smoking status: Former Smoker    Quit date: 02/27/1983  . Smokeless tobacco: Never Used  . Alcohol use No     Allergies   Meperidine; Midazolam; Pravastatin; Propafenone hcl er; Sulfa antibiotics; and Penicillins   Review of Systems Review of Systems  Constitutional: Negative for chills and fever.  HENT: Negative for ear discharge, ear pain, facial swelling, sore throat, tinnitus, trouble swallowing and voice change.   Eyes: Negative for photophobia, pain, redness and visual disturbance.  Respiratory: Positive for chest tightness. Negative for cough, shortness of breath, wheezing and stridor.        Patient reporting pleuritic chest pain on deep inhale  Cardiovascular: Negative for chest pain and palpitations.  Gastrointestinal: Negative for abdominal pain, nausea and vomiting.  Genitourinary: Negative for dysuria and hematuria.  Musculoskeletal: Positive for back pain, myalgias and neck pain. Negative for arthralgias, gait problem, joint swelling and neck stiffness.  Skin: Negative for color change, pallor and rash.  Neurological: Negative for dizziness, tremors, seizures, syncope, facial asymmetry, speech difficulty, weakness, light-headedness, numbness and headaches.       Pain to the back of the head from fall     Physical Exam Updated Vital Signs BP (!) 194/72   Pulse (!) 58   Temp 98.1 F (36.7 C) (Oral)   Resp 19   Ht 5\' 6"  (1.676 m)   Wt 54.4 kg (120 lb)   SpO2 99%   BMI 19.37 kg/m   Physical Exam  Constitutional: She is oriented to person, place, and time. She appears well-developed and well-nourished. No distress.  Afebrile, nontoxic-appearing, lying comfortably in bed in no acute distress.  HENT:  Head: Normocephalic and atraumatic.  Mouth/Throat: Oropharynx is clear and moist. No oropharyngeal exudate.  Eyes: Pupils are equal, round, and reactive to light. Conjunctivae and EOM are normal. Right  eye exhibits no discharge. Left eye exhibits no discharge.  Neck:  Patient in c-collar  Cardiovascular: Normal rate, regular rhythm, normal heart sounds and intact distal pulses.   No murmur heard. Pulmonary/Chest: Effort normal and breath sounds normal. No respiratory distress. She has no wheezes. She has no rales. She exhibits no tenderness.  She denied any tenderness palpation of the chest wall  Abdominal: Soft. She exhibits no distension and no mass. There is no tenderness. There is no guarding.  Musculoskeletal: Normal range of motion. She exhibits tenderness. She exhibits no edema or deformity.  Midline tenderness to palpation of the thoracic spine T10.  Neurological: She is alert and oriented to person, place, and time. No cranial nerve deficit  or sensory deficit. She exhibits normal muscle tone. Coordination normal.  Neurologic Exam:  - Mental status: Patient is alert and cooperative. Fluent speech and words are clear. Coherent thought processes and insight is good. Patient is oriented x 4 to person, place, time and event.  - Cranial nerves:  CN III, IV, VI: pupils equally round, reactive to light both direct and conscensual. Full extra-ocular movement. CN V: motor temporalis and masseter strength intact. CN VII : muscles of facial expression intact. CN X :  midline uvula. XI strength of sternocleidomastoid and trapezius muscles 5/5, XII: tongue is midline when protruded. - Motor: No involuntary movements. Muscle tone and bulk normal throughout. Muscle strength is 5/5 in bilateral shoulder abduction, elbow flexion and extension, grip, hip extension, flexion, leg flexion and extension, ankle dorsiflexion and plantar flexion.  - Sensory: Proprioception, light tough sensation intact in all extremities.  - Cerebellar: rapid alternating movements and point to point movement intact in upper and lower extremities. Patient was able to ambulate to bathroom with nurse. She was at baseline prior to  discharge.  Skin: Skin is warm and dry. No rash noted. She is not diaphoretic. No erythema. No pallor.  Psychiatric: She has a normal mood and affect.  Nursing note and vitals reviewed.    ED Treatments / Results  Labs (all labs ordered are listed, but only abnormal results are displayed) Labs Reviewed - No data to display  EKG  EKG Interpretation None       Radiology Dg Chest 2 View  Result Date: 11/02/2016 CLINICAL DATA:  Pleuritic chest pain after falling today. Back pain. EXAM: CHEST  2 VIEW COMPARISON:  09/02/2016 FINDINGS: The heart is enlarged. There is prominence of interstitial markings and perihilar peribronchial thickening, stable in appearance. There are no focal consolidations or pleural effusions. No pulmonary edema. No pneumothorax. No acute displaced fractures. IMPRESSION: 1. Stable cardiomegaly. 2.  No evidence for acute  abnormality. Electronically Signed   By: Nolon Nations M.D.   On: 11/02/2016 16:48   Dg Thoracic Spine 2 View  Result Date: 11/02/2016 CLINICAL DATA:  Pleuritic chest pain after falling today. Back pain. EXAM: THORACIC SPINE 2 VIEWS COMPARISON:  03/04/2015, 09/02/2016 FINDINGS: There normal alignment of the thoracic spine. No acute fracture or subluxation identified. No suspicious lytic or blastic lesions are identified. IMPRESSION: Negative. Electronically Signed   By: Nolon Nations M.D.   On: 11/02/2016 16:51   Ct Head Wo Contrast  Result Date: 11/02/2016 CLINICAL DATA:  82 year old female with history of trauma after fall out of bed. EXAM: CT HEAD WITHOUT CONTRAST CT CERVICAL SPINE WITHOUT CONTRAST TECHNIQUE: Multidetector CT imaging of the head and cervical spine was performed following the standard protocol without intravenous contrast. Multiplanar CT image reconstructions of the cervical spine were also generated. COMPARISON:  Head CT 09/03/2016. FINDINGS: CT HEAD FINDINGS Brain: Mild atrophy. Patchy areas of mild decreased attenuation are  noted throughout the deep and periventricular white matter of the cerebral hemispheres bilaterally, compatible with mild chronic microvascular ischemic disease. No evidence of acute infarction, hemorrhage, hydrocephalus, extra-axial collection or mass lesion/mass effect. Vascular: No hyperdense vessel or unexpected calcification. Skull: Normal. Negative for fracture or focal lesion. Sinuses/Orbits: Dependent secretions in the left sphenoid sinus, similar to the prior study. Other: None. CT CERVICAL SPINE FINDINGS Alignment: Normal. Skull base and vertebrae: No acute fracture. No primary bone lesion or focal pathologic process. Soft tissues and spinal canal: No prevertebral fluid or swelling. No visible canal hematoma. Disc levels: Mild  multilevel degenerative disc disease, most apparent at C4-C5. Mild multilevel facet arthropathy. Upper chest: Negative. Other: None. IMPRESSION: 1. No evidence of significant acute traumatic injury to the skull, brain or cervical spine. 2. Mild cerebral atrophy with very mild chronic microvascular ischemic changes in the cerebral white matter. 3. Mild multilevel degenerative disc disease and cervical spondylosis, as above. Electronically Signed   By: Vinnie Langton M.D.   On: 11/02/2016 16:35   Ct Cervical Spine Wo Contrast  Result Date: 11/02/2016 CLINICAL DATA:  82 year old female with history of trauma after fall out of bed. EXAM: CT HEAD WITHOUT CONTRAST CT CERVICAL SPINE WITHOUT CONTRAST TECHNIQUE: Multidetector CT imaging of the head and cervical spine was performed following the standard protocol without intravenous contrast. Multiplanar CT image reconstructions of the cervical spine were also generated. COMPARISON:  Head CT 09/03/2016. FINDINGS: CT HEAD FINDINGS Brain: Mild atrophy. Patchy areas of mild decreased attenuation are noted throughout the deep and periventricular white matter of the cerebral hemispheres bilaterally, compatible with mild chronic microvascular  ischemic disease. No evidence of acute infarction, hemorrhage, hydrocephalus, extra-axial collection or mass lesion/mass effect. Vascular: No hyperdense vessel or unexpected calcification. Skull: Normal. Negative for fracture or focal lesion. Sinuses/Orbits: Dependent secretions in the left sphenoid sinus, similar to the prior study. Other: None. CT CERVICAL SPINE FINDINGS Alignment: Normal. Skull base and vertebrae: No acute fracture. No primary bone lesion or focal pathologic process. Soft tissues and spinal canal: No prevertebral fluid or swelling. No visible canal hematoma. Disc levels: Mild multilevel degenerative disc disease, most apparent at C4-C5. Mild multilevel facet arthropathy. Upper chest: Negative. Other: None. IMPRESSION: 1. No evidence of significant acute traumatic injury to the skull, brain or cervical spine. 2. Mild cerebral atrophy with very mild chronic microvascular ischemic changes in the cerebral white matter. 3. Mild multilevel degenerative disc disease and cervical spondylosis, as above. Electronically Signed   By: Vinnie Langton M.D.   On: 11/02/2016 16:35    Procedures Procedures (including critical care time)  Medications Ordered in ED Medications  acetaminophen (TYLENOL) tablet 650 mg (650 mg Oral Given 11/02/16 1737)     Initial Impression / Assessment and Plan / ED Course  I have reviewed the triage vital signs and the nursing notes.  Pertinent labs & imaging results that were available during my care of the patient were reviewed by me and considered in my medical decision making (see chart for details).    Patient history of dementia presenting after a slip and fall. She is currently on anticoagulant for A. fib.  CT head negative, CT cervical spine negative, chest x-ray negative.  Patient reported significant improvement after removal of c-collar. Pain was managed while in the emergency department.  Her chest discomfort appeared to be at the location where  the c-collar was exerting pressure. She denied any chest pain thereafter.  Patient improved while in the ED after Tylenol. She was ambulatory. Normal neuro exam.   No blurred vision, dizziness, nausea, vomiting or other symptoms. Daughter is in the room reporting that she is at baseline.  Patient was advised to apply heat to her back and neck for muscle spasm. She typically takes Tylenol for pain.  Advised to continue with Tylenol as patient would not benefit from mind altering agents which may promote further falls. She was comfortable and in no acute distress prior to discharge.  Discussed strict return precautions and advised to return to the emergency department if experiencing any new or worsening symptoms. Instructions were understood  and patient agreed with discharge plan. Final Clinical Impressions(s) / ED Diagnoses   Final diagnoses:  Fall in home, initial encounter  Minor head injury, initial encounter  Neck pain  Chest wall pain    New Prescriptions Discharge Medication List as of 11/02/2016  6:22 PM       Emeline General, PA-C 11/02/16 2034    Milton Ferguson, MD 11/05/16 [phone]

## 2016-11-02 NOTE — Discharge Instructions (Signed)
As discussed, use heat on the back of your neck and tylenol for pain. Follow up with your primary care provider next week.  Return if you experience a bad headache, dizziness, blurred vision, nausea, vomiting or other concerning symptoms in the meantime.

## 2016-11-02 NOTE — ED Notes (Signed)
Patient transported to CT 

## 2016-11-08 ENCOUNTER — Other Ambulatory Visit: Payer: Self-pay | Admitting: Internal Medicine

## 2016-11-08 DIAGNOSIS — Z1231 Encounter for screening mammogram for malignant neoplasm of breast: Secondary | ICD-10-CM

## 2016-11-16 ENCOUNTER — Other Ambulatory Visit: Payer: Self-pay | Admitting: Pharmacist

## 2016-11-16 MED ORDER — APIXABAN 2.5 MG PO TABS: 2.5000 mg | ORAL_TABLET | Freq: Two times a day (BID) | ORAL | 5 refills | Status: DC

## 2016-11-16 NOTE — Progress Notes (Signed)
Decreasing dose of Eliquis to 2.5mg  BID since pt now age 82 and wt > 60kg. Pt's daughter/power of attorney who helps with medications has been made aware.

## 2016-11-23 ENCOUNTER — Encounter: Payer: Self-pay | Admitting: Cardiovascular Disease

## 2016-11-23 ENCOUNTER — Ambulatory Visit (INDEPENDENT_AMBULATORY_CARE_PROVIDER_SITE_OTHER): Payer: Medicare Other | Admitting: Cardiovascular Disease

## 2016-11-23 VITALS — BP 136/66 | HR 63 | Resp 16 | Ht 66.0 in | Wt 120.8 lb

## 2016-11-23 DIAGNOSIS — I341 Nonrheumatic mitral (valve) prolapse: Secondary | ICD-10-CM

## 2016-11-23 DIAGNOSIS — I480 Paroxysmal atrial fibrillation: Secondary | ICD-10-CM | POA: Diagnosis not present

## 2016-11-23 NOTE — Progress Notes (Signed)
Candice Schultz Date of Birth  August 20, 1934 [facility] / [facility] [address]     [place], [place]  [address] [phone]  Fax  [phone]  Problem List: 1. Atrial Fibrillation 2. Chest pain - normal coronary arteries 2010 3.  hyperlipidemia 4. Hx of Breast cancer - 1999  History of Present Illness:  Candice Schultz has done well.  Her rhythm has remained stable. She continues to have constipation due to the Flecainide.  She takes propranolol occasionally .  She is exercising on occasion.  09/08/2012  Candice Schultz is doing well.  She has been having some memory loss and she has been holding the pravachol.  Her last LDL at Dr., Jenny Reichmann Delene Ruffini office was 122.  Total chol was 227. Trigs = 151  She remains active.  She has occasional palpitations and takes PRN propranolol.    September 10, 2013:  Candice Schultz is doing OK.  She has white coat HTN .  BP at  Home is always ok.  BP of 110 typically.   Staying in NSR. No CP or dyspnea.  She has bee losing  weight without any explanation   She had an episode CP while in [place] on Oct.   Was [facility] center - cardiac cath looked great.    September 20, 2014:  Has had a good year. No CP or dyspnea  Able to do all of her normal activities without problems .  Is concerned about some weight loss Will be talking to Dr. Laurann Montana about it .  Aug. 28, 2017  Doing well from a cardiac stanpoint  No CP or dyspnea.  Not getting much exercise.  Getting out to [facility].  Feb. 13, 2018:  Doing OK Eating ok Her children are concerned about her diet and her weight loss  No CP or dyspnea  Sept. 27, 2018:   Seen with daughter, Candice Schultz Follow up for her MVP and atrial fib  Is on Eliquis 2.5 BID  Was recently diagnosed with Lewy Body  Dementia and she is on Nuplazid She seems to be improving with this  No real issues with CP  Has occasional dizzy spells   Current Outpatient Prescriptions on File Prior to Visit   Medication Sig Dispense Refill  . acetaminophen (TYLENOL) 500 MG tablet Take 500 mg by mouth every 6 (six) hours as needed for headache.    Marland Kitchen apixaban (ELIQUIS) 2.5 MG TABS tablet Take 1 tablet (2.5 mg total) by mouth 2 (two) times daily. 60 tablet 5  . Calcium Citrate-Vitamin D (CALCIUM CITRATE + D PO) Take 1 tablet by mouth 2 (two) times daily.    Marland Kitchen DIGOX 125 MCG tablet TAKE 1 TABLET BY MOUTH EVERY DAY (Patient taking differently: TAKE 125MG  BY MOUTH EVERY DAY) 90 tablet 1  . docusate sodium (COLACE) 100 MG capsule Take 100 mg by mouth 2 (two) times daily.    . flecainide (TAMBOCOR) 100 MG tablet TAKE 1 TABLET BY MOUTH TWICE DAILY (Patient taking differently: TAKE 100MG  BY MOUTH TWICE DAILY) 180 tablet 1  . hydroxypropyl methylcellulose / hypromellose (ISOPTO TEARS / GONIOVISC) 2.5 % ophthalmic solution Place 1 drop into both eyes 3 (three) times daily as needed for dry eyes.    Marland Kitchen LORazepam (ATIVAN) 0.5 MG tablet Take 0.25-0.5 mg by mouth every 6 (six) hours as needed for anxiety.     . Multiple Vitamin (MULTIVITAMIN) tablet Take 1 tablet by mouth daily.     Debby Freiberg  Tartrate (NUPLAZID) 17 MG TABS Take 34 mg by mouth daily. 60 tablet 6  . traZODone (DESYREL) 50 MG tablet Take 0.5 tablets (25 mg total) by mouth at bedtime. (Patient taking differently: Take 50 mg by mouth at bedtime. ) 15 tablet 0   No current facility-administered medications on file prior to visit.     Allergies  Allergen Reactions  . Meperidine     Other reaction(s): nausea  . Midazolam     Other reaction(s): nausea  . Pravastatin Other (See Comments)    Memory Issues  . Propafenone Hcl Er Other (See Comments)    unknown  . Sulfa Antibiotics     Other reaction(s): nausea   . Penicillins Rash    Past Medical History:  Diagnosis Date  . AF (atrial fibrillation) ([place])   . Anxiety   . Cancer (HCC)    breast  . Chest pain   . Constipation   . Dementia    mild  . Family history of adverse reaction to  anesthesia    " my daughter"  . Heart palpitations    related for flecainide and due to having afib   . Hyperlipidemia   . Hypertension     Past Surgical History:  Procedure Laterality Date  . ABDOMINAL HYSTERECTOMY    . APPENDECTOMY    . BREAST BIOPSY    . CARDIAC CATHETERIZATION  08/2008   NORMAL CORONARY ARTERIES    History  Smoking Status  . Former Smoker  . Quit date: 02/27/1983  Smokeless Tobacco  . Never Used    History  Alcohol Use No    Family History  Problem Relation Age of Onset  . Cancer Mother        Brain  . Cancer Sister        Breast  . Cancer - Other Brother     Reviw of Systems:  Reviewed in the HPI.  All other systems are negative.  Physical Exam: Blood pressure 136/66, pulse 63, resp. rate 16, height 5\' 6"  (1.676 m), weight 120 lb 12.8 oz (54.8 kg), SpO2 98 %.  GEN:  Thin, elderly female,    HEENT: Normal NECK: No JVD; No carotid bruits LYMPHATICS: No lymphadenopathy CARDIAC: RRR 2/6 systolic murmur , rubs, gallops RESPIRATORY:  Clear to auscultation without rales, wheezing or rhonchi  ABDOMEN: Soft, non-tender, non-distended MUSCULOSKELETAL:  No edema; No deformity  SKIN: Warm and dry NEUROLOGIC:  Alert and oriented x 3   ECG: Aug. 28, 2017:  NSR at 69.  1st degree AV block  LVH with repol  Assessment / Plan:   1. Atrial Fibrillation  - has maintained NSR eliquis is now 2.5 BID   2. Chest pain - no CAD by cath   3.  Hyperlipidemia - stable   4. Hx of Breast cancer - 1999  5. MVP - asymptomatic   6. Dementia -  Has been diagnosed with Lewy Body Dementia .   Further management per neuro   [name], MD  11/23/2016 2:34 PM    [place],  [place], [place]  [address] Pager [phone] Phone: [phone]; Fax: [phone]

## 2016-11-23 NOTE — Patient Instructions (Signed)

## 2016-12-12 ENCOUNTER — Ambulatory Visit
Admission: RE | Admit: 2016-12-12 | Discharge: 2016-12-12 | Disposition: A | Discharge status: Home / Self Care | Payer: Medicare Other | Source: Ambulatory Visit | Attending: Internal Medicine | Admitting: Internal Medicine

## 2016-12-12 DIAGNOSIS — Z1231 Encounter for screening mammogram for malignant neoplasm of breast: Secondary | ICD-10-CM

## 2017-01-01 ENCOUNTER — Encounter: Payer: Self-pay | Admitting: Neurology

## 2017-01-01 ENCOUNTER — Ambulatory Visit: Payer: Medicare Other | Admitting: Neurology

## 2017-01-01 VITALS — BP 126/62 | HR 63 | Ht 66.0 in | Wt 123.0 lb

## 2017-01-01 DIAGNOSIS — F0281 Dementia in other diseases classified elsewhere with behavioral disturbance: Secondary | ICD-10-CM

## 2017-01-01 DIAGNOSIS — G3183 Dementia with Lewy bodies: Secondary | ICD-10-CM | POA: Diagnosis not present

## 2017-01-01 MED ORDER — PIMAVANSERIN TARTRATE 17 MG PO TABS: 34.0000 mg | ORAL_TABLET | Freq: Every day | ORAL | 6 refills | Status: DC

## 2017-01-01 NOTE — Progress Notes (Signed)
NEUROLOGY FOLLOW UP OFFICE NOTE  Candice Schultz [id number]  DOB: 11/01/1935  HISTORY OF PRESENT ILLNESS: I had the pleasure of seeing Candice Schultz in follow-up in the neurology clinic on 01/01/2017.  The patient was last seen 5 months ago for hallucinations and tremor, which has been a change in symptoms. She is again accompanied by her daughter who helps supplement the history today. She was started on Nuplazid on her last visit, which she is tolerating well. 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 She would say, "I am 82 years old, I should not be working like this." She is also on Trazodone, which helps significantly with sleep. Her daughter has noticed mild decline in communication skills. She needs more help with dressing. She had stopped Exelon (nausea), had rash on the patch. She has 24/7 care, recently having night care while her daughter and son-in-law who stay during the day go home to sleep. She denies any headaches, dizziness, vision changes, dysarthria/dysphagia, back pain, bladder dysfunction, anosmia.   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 She reports her memory is "sometimes excellent, sometimes not so good." She denies missing medications or missed bill payments, but her daughter states her husband had mentioned she paid a bill twice. Her family started noticing memory changes around 2 years ago. Her daughter has a list of several concerning events. Last summer, she saw her parents prepare their pillboxes, and it took her mother 1-1/2 hours to put her medications in the weekly containing, repeatedly checking and re-checking. She has seen a couple of situations with money, one time she saw her mother in the nail salon being assisted by staff  with counting money. Another time they were at the laboratory for blood draw with the front desk asking for payment, she kept looking at her card and staff had to repeat amount. Her daughter has noticed more difficulties with processing information. Last summer, she had written the wrong amount on a check and another time gave her the incorrect amount for her share in a gift they bought together. She is a good cook and good with recipes, but family has seen this dwindle, they are seeing signs that it is more difficult for her and she takes a longer time to cook things. She gets easily confused when on the phone. She used to use her laptop with emails and do puzzles in the newspaper, but the computer has become hard and very confusing for her and she does not attempt to do it anymore. Maintaining a calendar is a challenge, she puts the appointment in a different month. She would pick up someone's purse when she is at her daughter's house thinking it was her, which was not even similar to the one she brought in. Friends have noticed this as well and asked family what is going on. One time she saw friends at the store, and they felt they had to follow her to make sure she got back to her husband fine. Her daughter has also noticed she is not as communicative, more quiet, with difficulty with her conversation skills. She would stop in the middle of a sentence and get lost, having to think of what she was saying or having difficulty finding the words. She denies getting lost driving, but her  daughter reports one time where her husband had to get her and follow her home. There is no family history of dementia, although her grandmother had memory problems and was diagnosed with a brain tumor. She denies any significant head injuries or alcohol intake.   Update 08/10/2016: Her daughter lives 2 doors down, she reports hallucinations that started a year ago. She has told her daughter she has seen and heard things, students  sleep all over the house, in chairs, and have stolen the car and driven without permission. She asks her daughter what program these people are doing, "these students, I don't understand what they are doing." Most of the hallucinations appear visual, however they report some auditory hallucinations where "they said they own part of the deed." The patient states "those things are true." A year ago, she was frightened once hearing a voice at 1am, she opened the front door to figure out where the voices were coming from. Her daughter has not noticed her mother talking back to the hallucinations. On an almost daily basis, the patient asks where the other Candice Schultz is, "our daughter is the other Candice Schultz." She states there are people in the house right now, when they left the home, "they were friends of yours." Over the past 6-7 months, her daughter has also noticed a tremor in the right hand, initially occurring occasionally, but now almost daily for the past few months. They are worse when she is anxious or nervous. She has lost dexterity opening cans or using a knife with her fingers. She has noticed less fluidity in her mother's movements, she does not feel stiff, no falls. Her daughter is in charge of her parents' medications and food. She has been taking care of bills for the past 16 months. She can handle self-care by herself, but mostly needs help drying her hair or getting an arm in her coat/sweater. She was started on Seroquel for the hallucinations, this has helped her sleep better, but hallucinations continue. She did not tolerate BID dosing, and only takes it at night.   Diagnostic Data: B12 level normal 776 I personally reviewed MRI brain with and without contrast which did not show any acute changes. There was mild diffuse atrophy, minimal chronic microvascular disease.   PAST MEDICAL HISTORY: Past Medical History:  Diagnosis Date  . AF (atrial fibrillation) ([place])   . Anxiety   . Cancer (HCC)     breast  . Chest pain   . Constipation   . Dementia    mild  . Family history of adverse reaction to anesthesia    " my daughter"  . Heart palpitations    related for flecainide and due to having afib   . Hyperlipidemia   . Hypertension     MEDICATIONS: Current Outpatient Medications on File Prior to Visit  Medication Sig Dispense Refill  . acetaminophen (TYLENOL) 500 MG tablet Take 500 mg by mouth every 6 (six) hours as needed for headache.    Marland Kitchen apixaban (ELIQUIS) 2.5 MG TABS tablet Take 1 tablet (2.5 mg total) by mouth 2 (two) times daily. 60 tablet 5  . Calcium Citrate-Vitamin D (CALCIUM CITRATE + D PO) Take 1 tablet by mouth 2 (two) times daily.    Marland Kitchen DIGOX 125 MCG tablet TAKE 1 TABLET BY MOUTH EVERY DAY (Patient taking differently: TAKE 125MG  BY MOUTH EVERY DAY) 90 tablet 1  . docusate sodium (COLACE) 100 MG capsule Take 100 mg by mouth 2 (two) times daily.    Marland Kitchen  flecainide (TAMBOCOR) 100 MG tablet TAKE 1 TABLET BY MOUTH TWICE DAILY (Patient taking differently: TAKE 100MG  BY MOUTH TWICE DAILY) 180 tablet 1  . hydroxypropyl methylcellulose / hypromellose (ISOPTO TEARS / GONIOVISC) 2.5 % ophthalmic solution Place 1 drop into both eyes 3 (three) times daily as needed for dry eyes.    Marland Kitchen LORazepam (ATIVAN) 0.5 MG tablet Take 0.25-0.5 mg by mouth every 6 (six) hours as needed for anxiety.     . metoprolol succinate (TOPROL-XL) 25 MG 24 hr tablet Take 25 mg by mouth daily.    . Multiple Vitamin (MULTIVITAMIN) tablet Take 1 tablet by mouth daily.     Candice Schultz Tartrate (NUPLAZID) 17 MG TABS Take 34 mg by mouth daily. 60 tablet 6  . traZODone (DESYREL) 50 MG tablet Take 0.5 tablets (25 mg total) by mouth at bedtime. (Patient taking differently: Take 50 mg by mouth at bedtime. ) 15 tablet 0   No current facility-administered medications on file prior to visit.     ALLERGIES: Allergies  Allergen Reactions  . Meperidine     Other reaction(s): nausea  . Midazolam     Other  reaction(s): nausea  . Pravastatin Other (See Comments)    Memory Issues  . Propafenone Hcl Er   . Sulfa Antibiotics     Other reaction(s): nausea   . Penicillins Rash        FAMILY HISTORY: Family History  Problem Relation Age of Onset  . Cancer Mother        Brain  . Cancer Sister        Breast  . Cancer - Other Brother     SOCIAL HISTORY: Social History   Socioeconomic History  . Marital status: Married    Spouse name: Not on file  . Number of children: 2  . Years of education: Not on file  . Highest education level: Not on file  Social Needs  . Financial resource strain: Not on file  . Food insecurity - worry: Not on file  . Food insecurity - inability: Not on file  . Transportation needs - medical: Not on file  . Transportation needs - non-medical: Not on file  Occupational History  . Occupation: Retired  Tobacco Use  . Smoking status: Former Smoker    Last attempt to quit: 02/27/1983    Years since quitting: 33.8  . Smokeless tobacco: Never Used  Substance and Sexual Activity  . Alcohol use: No    Alcohol/week: 0.0 oz  . Drug use: No  . Sexual activity: Not on file  Other Topics Concern  . Not on file  Social History Narrative  . Not on file    REVIEW OF SYSTEMS: Constitutional: No fevers, chills, or sweats, no generalized fatigue, change in appetite Eyes: No visual changes, double vision, eye pain Ear, nose and throat: No hearing loss, ear pain, nasal congestion, sore throat Cardiovascular: No chest pain, palpitations Respiratory:  No shortness of breath at rest or with exertion, wheezes GastrointestinaI: No nausea, vomiting, diarrhea, abdominal pain, fecal incontinence Genitourinary:  No dysuria, urinary retention or frequency Musculoskeletal:  No neck pain, back pain Integumentary: No rash, pruritus, skin lesions Neurological: as above Psychiatric: No depression, insomnia, anxiety Endocrine: No palpitations, fatigue, diaphoresis, mood swings,  change in appetite, change in weight, increased thirst Hematologic/Lymphatic:  No anemia, purpura, petechiae. Allergic/Immunologic: no itchy/runny eyes, nasal congestion, recent allergic reactions, rashes  PHYSICAL EXAM: Vitals:   01/01/17 1615  BP: 126/62  Pulse: 63  SpO2: 98%  General: No acute distress, flat affect Head:  Normocephalic/atraumatic Neck: supple, no paraspinal tenderness, full range of motion Back: No paraspinal tenderness Heart: regular rate and rhythm Lungs: Clear to auscultation bilaterally. Vascular: No carotid bruits. Skin/Extremities: No rash, no edema Neurological Exam: Mental status: alert and oriented to person, place, states it is October 19something. Knows the day of week and season, no dysarthria or aphasia, Fund of knowledge is appropriate.  Recent and remote memory impaired.  Attention and concentration are reduced. Able to name objects and repeat phrases.  MMSE - Mini Mental State Exam 01/01/2017 08/10/2016  Orientation to time 2 1  Orientation to Place 5 5  Registration 3 3  Attention/ Calculation 0 2  Recall 0 3  Language- name 2 objects 2 2  Language- repeat 1 1  Language- follow 3 step command 2 2  Language- read & follow direction 1 1  Write a sentence 0 1  Copy design 0 0  Total score 16 21   Cranial nerves: CN I: not tested CN II: pupils equal, round and reactive to light, visual fields intact CN III, IV, VI:  full range of motion, no nystagmus, no ptosis CN V: facial sensation intact CN VII: upper and lower face symmetric CN VIII: hearing intact to finger rub CN IX, X: gag intact, uvula midline CN XI: sternocleidomastoid and trapezius muscles intact CN XII: tongue midline Bulk & Tone: normal, +right cogwheeling, no fasciculations. Motor: 5/5 throughout with no pronator drift. Sensation: intact to light touch.  No extinction to double simultaneous stimulation.  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 There is a decline in MMSE today, 16/30 (previously 21/30 in June 2018). She had side effects on Exelon, we have agreed to hold off on starting other dementia medications at this point, continue management of behavioral changes likely due to Lewy Body dementia, she is on Nuplazid for hallucinations. She does not drive. Continue 24/7 care. She will follow-up in 6 months and knows to call for any changes.   Thank you for allowing me to participate in her care.  Please do not hesitate to call for any questions or concerns.  The duration of this appointment visit was 25 minutes of face-to-face time with the patient.  Greater than 50% of this time was spent in counseling, explanation of diagnosis, planning of further management, and coordination of care.   Ellouise Newer, M.D.   CC: Dr. Laurann Montana

## 2017-01-01 NOTE — Patient Instructions (Signed)
Continue all your medications, follow-up in 6 months, call for any changes  FALL PRECAUTIONS: Be cautious when walking. Scan the area for obstacles that may increase the risk of trips and falls. When getting up in the mornings, sit up at the edge of the bed for a few minutes before getting out of bed. Consider elevating the bed at the head end to avoid drop of blood pressure when getting up. Walk always in a well-lit room (use night lights in the walls). Avoid area rugs or power cords from appliances in the middle of the walkways. Use a walker or a cane if necessary and consider physical therapy for balance exercise. Get your eyesight checked regularly.  FINANCIAL OVERSIGHT: Supervision, especially oversight when making financial decisions or transactions is also recommended.  HOME SAFETY: Consider the safety of the kitchen when operating appliances like stoves, microwave oven, and blender. Consider having supervision and share cooking responsibilities until no longer able to participate in those. Accidents with firearms and other hazards in the house should be identified and addressed as well.  DRIVING: Regarding driving, in patients with progressive memory problems, driving will be impaired. We advise to have someone else do the driving if trouble finding directions or if minor accidents are reported. Independent driving assessment is available to determine safety of driving.  ABILITY TO BE LEFT ALONE: If patient is unable to contact 911 operator, consider using LifeLine, or when the need is there, arrange for someone to stay with patients. Smoking is a fire hazard, consider supervision or cessation. Risk of wandering should be assessed by caregiver and if detected at any point, supervision and safe proof recommendations should be instituted.  MEDICATION SUPERVISION: Inability to self-administer medication needs to be constantly addressed. Implement a mechanism to ensure safe administration of the  medications.  RECOMMENDATIONS FOR ALL PATIENTS WITH MEMORY PROBLEMS: 1. Continue to exercise (Recommend 30 minutes of walking everyday, or 3 hours every week) 2. Increase social interactions - continue going to [place] and enjoy social gatherings with friends and family 3. Eat healthy, avoid fried foods and eat more fruits and vegetables 4. Maintain adequate blood pressure, blood sugar, and blood cholesterol level. Reducing the risk of stroke and cardiovascular disease also helps promoting better memory. 5. Avoid stressful situations. Live a simple life and avoid aggravations. Organize your time and prepare for the next day in anticipation. 6. Sleep well, avoid any interruptions of sleep and avoid any distractions in the bedroom that may interfere with adequate sleep quality 7. Avoid sugar, avoid sweets as there is a strong link between excessive sugar intake, diabetes, and cognitive impairment We discussed the Mediterranean diet, which has been shown to help patients reduce the risk of progressive memory disorders and reduces cardiovascular risk. This includes eating fish, eat fruits and green leafy vegetables, nuts like almonds and hazelnuts, walnuts, and also use olive oil. Avoid fast foods and fried foods as much as possible. Avoid sweets and sugar as sugar use has been linked to worsening of memory function.  There is always a concern of gradual progression of memory problems. If this is the case, then we may need to adjust level of care according to patient needs. Support, both to the patient and caregiver, should then be put into place.

## 2017-01-07 ENCOUNTER — Encounter: Payer: Self-pay | Admitting: Neurology

## 2017-01-10 ENCOUNTER — Telehealth: Payer: Self-pay

## 2017-01-10 NOTE — Telephone Encounter (Signed)
Yes, she is still on the Nuplazid.  Daughter reports she is doing well on that.  Will call daughter and notify that she needs to speak with PCP about increasing.

## 2017-01-10 NOTE — Telephone Encounter (Signed)
Pt daughter, [name], called stating that pt is experiencing more hallucinations, especially during the night time hours.  Last night she accused her care-giver of bringing 8 people to her home and "throwing a party".  [name] states that pt has been getting up periodically through out the night x1 week, however the last 2 nights she has been up pretty much all night with hallucinations (pt is sleeping most of the day).  She is now becoming verbally abusive/combative with family and care givers.  Pt believes that family is trying to move her from her home, which makes her upset no matter how many times [name] explains that no one is moving pt.  Pt was prescribed Trazodone 50mg  during recent hospital visit (bottle states Dr. Inda Merlin) - sig is to take half tablet, pt takes full tablet.  [name] is wondering if Trazodone should be increased - please advise.

## 2017-01-10 NOTE — Telephone Encounter (Signed)
Is she still on nuplazid?  I presume that the trazodone is being given by PCP as Dr. Inda Merlin is one of the out patient PCP, and as an in patient it looks like it was given by Dr. Eliseo Squires.

## 2017-01-10 NOTE — Telephone Encounter (Signed)
Patient Daughter is calling about this please call her at [phone]

## 2017-01-11 NOTE — Telephone Encounter (Signed)
Received message from Florin from Dr. Delene Ruffini office asking for a call back.   Returned call.  No answer. Left detailed message for Blanch Media asking for documentation stating that Dr. Laurann Montana deferred medications to Dr. Delice Lesch.

## 2017-01-11 NOTE — Telephone Encounter (Signed)
Spoke with [place], relaying message below.  She states that pt's PCP (Dr. Laurann Montana, Jenny Reichmann) has deferred all non-PCP related aspects of pt to Dr. Delice Lesch, including Trazodone.  She states that Dr Eliseo Squires originally prescribed Trazodone, Dr. Inda Merlin increased dosage from half tablet to full tablet.  Still unsure as to what she is to do but did report that pt was less combative last night.  She was up and down less last night than previous 2 nights, but still refused to take a bath with care giver.  Daughter had to go over to pt's house to have pt bathe.   Called Dr. Delene Ruffini office for some clarification/guidance.  LMOM for his nurse to return my call.

## 2017-01-14 ENCOUNTER — Other Ambulatory Visit: Payer: Self-pay

## 2017-01-14 MED ORDER — TRAZODONE HCL 50 MG PO TABS: ORAL_TABLET | ORAL | 6 refills | Status: DC

## 2017-01-14 NOTE — Telephone Encounter (Signed)
Spoke with pt's daughter, [name], relaying message below.  [name] states that pt is doing better at night but still confused some during the day - especially when not at home.  States that any time she is at a family members home, she insists that "they" are moving her belongings.  [name] still does not know how "they" are.

## 2017-01-14 NOTE — Telephone Encounter (Signed)
Received after hours VM from Dr. Delene Ruffini office stating that pt and Dr. Laurann Montana never discussed pt's Trazodone during her recent visit with him.  No further information was given.

## 2017-01-14 NOTE — Telephone Encounter (Signed)
Ok to increase Trazodone 50mg : Take 1.5 tabs at bedtime. Pls send new Rx, thanks

## 2017-03-30 ENCOUNTER — Other Ambulatory Visit: Payer: Self-pay | Admitting: Cardiovascular Disease

## 2017-04-29 ENCOUNTER — Other Ambulatory Visit: Payer: Self-pay | Admitting: Cardiovascular Disease

## 2017-04-29 ENCOUNTER — Telehealth: Payer: Self-pay | Admitting: Neurology

## 2017-04-29 NOTE — Telephone Encounter (Signed)
Sorry Dr Delice Lesch, I met for it to go to [place], did not realize I routed to you, sorry [facility]

## 2017-04-29 NOTE — Telephone Encounter (Signed)
Hi Dawn, pls route to [facility] next time, [place] pls see what they need. Thanks!

## 2017-04-29 NOTE — Telephone Encounter (Signed)
[facility] left a VM message about pt's nuplazid prescription and would like a call back CB# [phone]

## 2017-04-29 NOTE — Telephone Encounter (Signed)
Spoke with rep from [place].  The manufacturer of Nuplazid has d/c'd 17mg  tablets.  Gave verbal Rx for 34mg , once daily.

## 2017-05-21 ENCOUNTER — Other Ambulatory Visit: Payer: Self-pay | Admitting: Neurology

## 2017-05-21 ENCOUNTER — Other Ambulatory Visit: Payer: Self-pay

## 2017-05-21 ENCOUNTER — Telehealth: Payer: Self-pay | Admitting: Neurology

## 2017-05-21 MED ORDER — RISPERIDONE 0.25 MG PO TABS: ORAL_TABLET | ORAL | 3 refills | Status: DC

## 2017-05-21 NOTE — Telephone Encounter (Signed)
Returned call to [place].  No answer.  LMOM asking for return call to the office.

## 2017-05-21 NOTE — Telephone Encounter (Signed)
Patient's daughter called with concerns and realizing she cannot talk or having anxiety regarding this. She is experiencing a lot of anxiety. She would like to speak with someone regarding any other medication intervention.She needs advise to help with these issues.  Please Call. Thanks

## 2017-05-21 NOTE — Telephone Encounter (Signed)
Pls let [name] know that we can try as needed Risperdal 0.25mg  up to twice a day if needed. It is not an anxiety medicine specifically, but can potentially help calm her down. Pls send Rx for Risperdal 0.25mg  prn for agitation, #30 with 3 refills, thanks

## 2017-05-21 NOTE — Telephone Encounter (Signed)
[name] returned my call.  She states that pt is still getting up multiple time a night and rummaging through the closets/moving things around.  States that pt does not believe that her home is actually her home and that "every one is tricking her into living there".  [name] is hoping there is something we can prescribe for pt's anxiety - states that pt's brain "never shuts off".  Advised Beverly to increase pt's Trazodone to 2 tabs each night to help with her sleep/night behavior.  Let [name] know I would send message to Dr. Delice Lesch in regards to anxiety medication.  Please advise.

## 2017-05-21 NOTE — Telephone Encounter (Signed)
Rx sent to [facility] on [address] in [place].  LMOM for [name] advising her that I have sent Rx.

## 2017-05-23 ENCOUNTER — Other Ambulatory Visit: Payer: Self-pay | Admitting: Cardiovascular Disease

## 2017-05-23 NOTE — Telephone Encounter (Signed)
Pt is a 83 yr old female. Saw Dr Acie Fredrickson 11/23/16. Wt 01/01/17 was 55.8Kg. SCr 09/04/16 was 0.73. Will refill Eliquis 2.5mg  BID.

## 2017-07-09 ENCOUNTER — Ambulatory Visit: Payer: Medicare Other | Admitting: Neurology

## 2017-07-09 ENCOUNTER — Encounter: Payer: Self-pay | Admitting: Neurology

## 2017-07-09 ENCOUNTER — Other Ambulatory Visit: Payer: Self-pay

## 2017-07-09 VITALS — BP 132/70 | HR 79 | Wt 124.0 lb

## 2017-07-09 DIAGNOSIS — G3183 Dementia with Lewy bodies: Secondary | ICD-10-CM

## 2017-07-09 DIAGNOSIS — F0281 Dementia in other diseases classified elsewhere with behavioral disturbance: Secondary | ICD-10-CM

## 2017-07-09 MED ORDER — TRAZODONE HCL 50 MG PO TABS: ORAL_TABLET | ORAL | 6 refills | Status: DC

## 2017-07-09 MED ORDER — PIMAVANSERIN TARTRATE 34 MG PO CAPS: 1.0000 | ORAL_CAPSULE | Freq: Every day | ORAL | 6 refills | Status: DC

## 2017-07-09 NOTE — Progress Notes (Signed)
NEUROLOGY FOLLOW UP OFFICE NOTE  TEWANA BOHLEN [id number]  DOB: 11/01/1935  HISTORY OF PRESENT ILLNESS: I had the pleasure of seeing Angeligue Bowne in follow-up in the neurology clinic on 07/09/2017.  The patient was last seen 6 months ago for Lewy Body dementia with hallucinations and right hand tremor. She is again accompanied by her daughter who helps supplement the history today. She is taking Nuplazid for hallucinations which her daughter feels helps her a lot. The hallucinations are much better, she is not talking about them a great deal and family has not been made aware of it too much. The Trazodone was increased a few months ago to 100mg  qhs, and this "words beautifully." Her daughter had called last March to report that her anxiety was high, she was getting up multiple times at night and rummaging through closets or moving things around. She did not believe her home was actually her home and "everyone is tricking her into living there." Her brain "never shuts off." She was given a prescription for Risperdal, her daughter reports that they have only used it a couple of times and did help a great deal. She has 24/7 care, with night-time caregivers staying with her. Her daughter denies any falls. She forgets that she should not be climbing to the second floor, but she has attempted a couple of times to go upstairs when her caregiver was in the bathroom. She climbed into the tub one time a couple of months ago and could not figure out how to get out. She needs assistance with dressing and bathing. She has rare incontinence and now wears disposable underwear. Daughters manage medications and finances. Her daughters would like to keep her home for another year to year and a half, but she is now on the waiting list for Memory Care.   She has had some sinus headaches. She denies any dizziness, vision changes, dysarthria/dysphagia, focal numbness/tingling/weakness. She now has a cane but is not used to it  yet.  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 She reports her memory is "sometimes excellent, sometimes not so good." She denies missing medications or missed bill payments, but her daughter states her husband had mentioned she paid a bill twice. Her family started noticing memory changes around 2 years ago. Her daughter has a list of several concerning events. Last summer, she saw her parents prepare their pillboxes, and it took her mother 1-1/2 hours to put her medications in the weekly containing, repeatedly checking and re-checking. She has seen a couple of situations with money, one time she saw her mother in the nail salon being assisted by staff with counting money. Another time they were at the laboratory for blood draw with the front desk asking for payment, she kept looking at her card and staff had to repeat amount. Her daughter has noticed more difficulties with processing information. Last summer, she had written the wrong amount on a check and another time gave her the incorrect amount for her share in a gift they bought together. She is a good cook and good with recipes, but family has seen this dwindle, they are seeing signs that it is more difficult for her and she takes a longer time to cook things. She gets easily confused when on the phone. She used to use her laptop with emails and do puzzles in  the newspaper, but the computer has become hard and very confusing for her and she does not attempt to do it anymore. Maintaining a calendar is a challenge, she puts the appointment in a different month. She would pick up someone's purse when she is at her daughter's house thinking it was her, which was not even similar to the one she brought in. Friends have noticed this as well and asked family what is going on. One time she saw friends at the store, and  they felt they had to follow her to make sure she got back to her husband fine. Her daughter has also noticed she is not as communicative, more quiet, with difficulty with her conversation skills. She would stop in the middle of a sentence and get lost, having to think of what she was saying or having difficulty finding the words. She denies getting lost driving, but her daughter reports one time where her husband had to get her and follow her home. There is no family history of dementia, although her grandmother had memory problems and was diagnosed with a brain tumor. She denies any significant head injuries or alcohol intake.   Update 08/10/2016: Her daughter lives 2 doors down, she reports hallucinations that started a year ago. She has told her daughter she has seen and heard things, students sleep all over the house, in chairs, and have stolen the car and driven without permission. She asks her daughter what program these people are doing, "these students, I don't understand what they are doing." Most of the hallucinations appear visual, however they report some auditory hallucinations where "they said they own part of the deed." The patient states "those things are true." A year ago, she was frightened once hearing a voice at 1am, she opened the front door to figure out where the voices were coming from. Her daughter has not noticed her mother talking back to the hallucinations. On an almost daily basis, the patient asks where the other [name] is, "our daughter is the other [name]." She states there are people in the house right now, when they left the home, "they were friends of yours." Over the past 6-7 months, her daughter has also noticed a tremor in the right hand, initially occurring occasionally, but now almost daily for the past few months. They are worse when she is anxious or nervous. She has lost dexterity opening cans or using a knife with her fingers. She has noticed less fluidity in her  mother's movements, she does not feel stiff, no falls. Her daughter is in charge of her parents' medications and food. She has been taking care of bills for the past 16 months. She can handle self-care by herself, but mostly needs help drying her hair or getting an arm in her coat/sweater. She was started on Seroquel for the hallucinations, this has helped her sleep better, but hallucinations continue. She did not tolerate BID dosing, and only takes it at night.   Diagnostic Data: B12 level normal 776 I personally reviewed MRI brain with and without contrast which did not show any acute changes. There was mild diffuse atrophy, minimal chronic microvascular disease.   PAST MEDICAL HISTORY: Past Medical History:  Diagnosis Date  . AF (atrial fibrillation) ([place])   . Anxiety   . Cancer ([facility])    breast  . Chest pain   . Constipation   . Dementia    mild  . Family history of adverse reaction to anesthesia    "  my daughter"  . Heart palpitations    related for flecainide and due to having afib   . Hyperlipidemia   . Hypertension     MEDICATIONS: Current Outpatient Medications on File Prior to Visit  Medication Sig Dispense Refill  . acetaminophen (TYLENOL) 500 MG tablet Take 500 mg by mouth every 6 (six) hours as needed for headache.    . Calcium Citrate-Vitamin D (CALCIUM CITRATE + D PO) Take 1 tablet by mouth 2 (two) times daily.    Marland Kitchen DIGOX 125 MCG tablet TAKE 1 TABLET BY MOUTH EVERY DAY 90 tablet 2  . docusate sodium (COLACE) 100 MG capsule Take 100 mg by mouth 2 (two) times daily.    Marland Kitchen ELIQUIS 2.5 MG TABS tablet TAKE 1 TABLET BY MOUTH TWICE DAILY 60 tablet 6  . flecainide (TAMBOCOR) 100 MG tablet TAKE 1 TABLET BY MOUTH TWICE DAILY 180 tablet 1  . hydroxypropyl methylcellulose / hypromellose (ISOPTO TEARS / GONIOVISC) 2.5 % ophthalmic solution Place 1 drop into both eyes 3 (three) times daily as needed for dry eyes.    Marland Kitchen LORazepam (ATIVAN) 0.5 MG tablet Take 0.25-0.5 mg by mouth every 6  (six) hours as needed for anxiety.     . metoprolol succinate (TOPROL-XL) 25 MG 24 hr tablet TAKE 1 TABLET(25 MG) BY MOUTH DAILY 90 tablet 2  . Multiple Vitamin (MULTIVITAMIN) tablet Take 1 tablet by mouth daily.     Debby Freiberg Tartrate (NUPLAZID) 17 MG TABS Take 34 mg daily by mouth. 60 tablet 6  . risperiDONE (RISPERDAL) 0.25 MG tablet TAKE 1 TABLET BY MOUTH AS NEEDED FOR AGITATION. MAY TAKE 2ND TABLET AS NEEDED 180 tablet 3  . traZODone (DESYREL) 50 MG tablet Take 1-1/2 tablets by mouth each night at bedtime (Patient taking 2 tabs qhs) 45 tablet 6   No current facility-administered medications on file prior to visit.     ALLERGIES: Allergies  Allergen Reactions  . Meperidine     Other reaction(s): nausea  . Midazolam     Other reaction(s): nausea  . Pravastatin Other (See Comments)    Memory Issues  . Propafenone Hcl Er   . Sulfa Antibiotics     Other reaction(s): nausea   . Penicillins Rash        FAMILY HISTORY: Family History  Problem Relation Age of Onset  . Cancer Mother        Brain  . Cancer Sister        Breast  . Cancer - Other Brother     SOCIAL HISTORY: Social History   Socioeconomic History  . Marital status: Married    Spouse name: Not on file  . Number of children: 2  . Years of education: Not on file  . Highest education level: Not on file  Occupational History  . Occupation: Retired  Scientific laboratory technician  . Financial resource strain: Not on file  . Food insecurity:    Worry: Not on file    Inability: Not on file  . Transportation needs:    Medical: Not on file    Non-medical: Not on file  Tobacco Use  . Smoking status: Former Smoker    Last attempt to quit: 02/27/1983    Years since quitting: 34.3  . Smokeless tobacco: Never Used  Substance and Sexual Activity  . Alcohol use: No    Alcohol/week: 0.0 oz  . Drug use: No  . Sexual activity: Not on file  Lifestyle  . Physical activity:    Days  per week: Not on file    Minutes per session:  Not on file  . Stress: Not on file  Relationships  . Social connections:    Talks on phone: Not on file    Gets together: Not on file    Attends religious service: Not on file    Active member of club or organization: Not on file    Attends meetings of clubs or organizations: Not on file    Relationship status: Not on file  . Intimate partner violence:    Fear of current or ex partner: Not on file    Emotionally abused: Not on file    Physically abused: Not on file    Forced sexual activity: Not on file  Other Topics Concern  . Not on file  Social History Narrative  . Not on file    REVIEW OF SYSTEMS: Constitutional: No fevers, chills, or sweats, no generalized fatigue, change in appetite Eyes: No visual changes, double vision, eye pain Ear, nose and throat: No hearing loss, ear pain, nasal congestion, sore throat Cardiovascular: No chest pain, palpitations Respiratory:  No shortness of breath at rest or with exertion, wheezes GastrointestinaI: No nausea, vomiting, diarrhea, abdominal pain, fecal incontinence Genitourinary:  No dysuria, urinary retention or frequency Musculoskeletal:  No neck pain, back pain Integumentary: No rash, pruritus, skin lesions Neurological: as above Psychiatric: No depression, insomnia, anxiety Endocrine: No palpitations, fatigue, diaphoresis, mood swings, change in appetite, change in weight, increased thirst Hematologic/Lymphatic:  No anemia, purpura, petechiae. Allergic/Immunologic: no itchy/runny eyes, nasal congestion, recent allergic reactions, rashes  PHYSICAL EXAM: Vitals:   07/09/17 1600  BP: 132/70  Pulse: 79  SpO2: 98%   General: No acute distress, flat affect, slightly disheveled appearance Head:  Normocephalic/atraumatic Neck: supple, no paraspinal tenderness, full range of motion Back: No paraspinal tenderness Heart: regular rate and rhythm Lungs: Clear to auscultation bilaterally. Vascular: No carotid  bruits. Skin/Extremities: No rash, no edema Neurological Exam: Mental status: alert and oriented to person. Year is 2000something. No dysarthria or aphasia, Fund of knowledge is reduced.  Recent and remote memory impaired. 2/3 immediate recall, 1/3 delayed recall. Attention and concentration are reduced, able to spell WORLD but unable to do it backward. Able to name objects, difficulty with repetition.  Cranial nerves: CN I: not tested CN II: pupils equal, round and reactive to light, visual fields intact CN III, IV, VI:  full range of motion, no nystagmus, no ptosis CN V: facial sensation intact CN VII: upper and lower face symmetric CN VIII: hearing intact to finger rub CN IX, X: gag intact, uvula midline CN XI: sternocleidomastoid and trapezius muscles intact CN XII: tongue midline Bulk & Tone: normal, +right cogwheeling, no fasciculations. Motor: 5/5 throughout with no pronator drift. Sensation: intact to light touch.  No extinction to double simultaneous stimulation.  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 Symptoms suggestive of Lewy Body dementia. There continues to be gradual progression, but the  behavioral changes/hallucinations are better controlled on Nuplazid, Trazodone, and prn Risperdal. We again discussed home safety, her daughters would like her home for another year or so  before transitioning to Memory Care. She does not drive. Continue 24/7 care. She will follow-up in 6 months and knows to call for any changes.   Thank you for allowing me to participate in her care.  Please do not hesitate to call for any questions or concerns.  The duration of this appointment visit was 25 minutes of face-to-face time with the patient.  Greater than 50% of this time was spent in counseling, explanation of diagnosis, planning of further management, and coordination of care.   Ellouise Newer, M.D.   CC: Dr. Laurann Montana

## 2017-07-09 NOTE — Patient Instructions (Signed)
Continue current medications. Follow-up in 6 months, call for any changes  FALL PRECAUTIONS: Be cautious when walking. Scan the area for obstacles that may increase the risk of trips and falls. When getting up in the mornings, sit up at the edge of the bed for a few minutes before getting out of bed. Consider elevating the bed at the head end to avoid drop of blood pressure when getting up. Walk always in a well-lit room (use night lights in the walls). Avoid area rugs or power cords from appliances in the middle of the walkways. Use a walker or a cane if necessary and consider physical therapy for balance exercise. Get your eyesight checked regularly.  FINANCIAL OVERSIGHT: Supervision, especially oversight when making financial decisions or transactions is also recommended.  HOME SAFETY: Consider the safety of the kitchen when operating appliances like stoves, microwave oven, and blender. Consider having supervision and share cooking responsibilities until no longer able to participate in those. Accidents with firearms and other hazards in the house should be identified and addressed as well.  DRIVING: Regarding driving, in patients with progressive memory problems, driving will be impaired. We advise to have someone else do the driving if trouble finding directions or if minor accidents are reported. Independent driving assessment is available to determine safety of driving.  ABILITY TO BE LEFT ALONE: If patient is unable to contact 911 operator, consider using LifeLine, or when the need is there, arrange for someone to stay with patients. Smoking is a fire hazard, consider supervision or cessation. Risk of wandering should be assessed by caregiver and if detected at any point, supervision and safe proof recommendations should be instituted.  MEDICATION SUPERVISION: Inability to self-administer medication needs to be constantly addressed. Implement a mechanism to ensure safe administration of the  medications.  RECOMMENDATIONS FOR ALL PATIENTS WITH MEMORY PROBLEMS: 1. Continue to exercise (Recommend 30 minutes of walking everyday, or 3 hours every week) 2. Increase social interactions - continue going to [place] and enjoy social gatherings with friends and family 3. Eat healthy, avoid fried foods and eat more fruits and vegetables 4. Maintain adequate blood pressure, blood sugar, and blood cholesterol level. Reducing the risk of stroke and cardiovascular disease also helps promoting better memory. 5. Avoid stressful situations. Live a simple life and avoid aggravations. Organize your time and prepare for the next day in anticipation. 6. Sleep well, avoid any interruptions of sleep and avoid any distractions in the bedroom that may interfere with adequate sleep quality 7. Avoid sugar, avoid sweets as there is a strong link between excessive sugar intake, diabetes, and cognitive impairment The Mediterranean diet has been shown to help patients reduce the risk of progressive memory disorders and reduces cardiovascular risk. This includes eating fish, eat fruits and green leafy vegetables, nuts like almonds and hazelnuts, walnuts, and also use olive oil. Avoid fast foods and fried foods as much as possible. Avoid sweets and sugar as sugar use has been linked to worsening of memory function.  There is always a concern of gradual progression of memory problems. If this is the case, then we may need to adjust level of care according to patient needs. Support, both to the patient and caregiver, should then be put into place.

## 2017-10-08 ENCOUNTER — Other Ambulatory Visit: Payer: Self-pay | Admitting: Cardiovascular Disease

## 2017-11-04 ENCOUNTER — Telehealth: Payer: Self-pay | Admitting: Neurology

## 2017-11-04 NOTE — Telephone Encounter (Signed)
Pls let daughter try increasing the Trazodone 50mg : take 3 tabs qhs (150mg ). I have an opening next week 9/18 at 4pm, if she can come in then. Keep Dec appt still. Thanks

## 2017-11-04 NOTE — Telephone Encounter (Signed)
Spoke with pt's daughter who states that pt has been getting progressively worse over the last month.  States that pt was recently seen for infections and given Rx for levaquin.  Daughter thought pt's behavior was due to that, however pt has not taken any levaquin in a week now and things are still worse.  Pt is not sleeping at night, nor taking naps during the day dispite taking (2) Tablets of Trazodone 50mg .  On 2 occasions pt undressed in front of care givers.  Daughter unsure if medications need to be adjusted  or if pt should be seen.  Pt scheduled for December 12th.  Please advise

## 2017-11-04 NOTE — Telephone Encounter (Signed)
Patient daughter called wanting to get the patient in sooner than her Dec appt. She is having some behaviors that are not like her mother such as completely undressing in front of her care takers, not sleeping (she slept about 2 hours last night) she is sun downing. Please call Delice Lesch is booked out until May 2020

## 2017-11-04 NOTE — Telephone Encounter (Signed)
Spoke with [employer] below.  Candice Schultz believes pt has enough Trazodone on hand for increase to last until next week.  Appointment scheduled for Wednesday, September 18 @ 4PM

## 2017-11-13 ENCOUNTER — Other Ambulatory Visit: Payer: Self-pay

## 2017-11-13 ENCOUNTER — Ambulatory Visit: Payer: Medicare Other | Admitting: Neurology

## 2017-11-13 ENCOUNTER — Encounter: Payer: Self-pay | Admitting: Neurology

## 2017-11-13 VITALS — BP 130/64 | HR 59 | Ht 66.0 in | Wt 125.0 lb

## 2017-11-13 DIAGNOSIS — F0281 Dementia in other diseases classified elsewhere with behavioral disturbance: Secondary | ICD-10-CM | POA: Diagnosis not present

## 2017-11-13 DIAGNOSIS — G3183 Dementia with Lewy bodies: Secondary | ICD-10-CM

## 2017-11-13 DIAGNOSIS — F02818 Dementia in other diseases classified elsewhere, unspecified severity, with other behavioral disturbance: Secondary | ICD-10-CM

## 2017-11-13 MED ORDER — BUPROPION HCL 75 MG PO TABS: ORAL_TABLET | ORAL | 6 refills | Status: DC

## 2017-11-13 MED ORDER — CARBIDOPA-LEVODOPA 25-100 MG PO TABS: ORAL_TABLET | ORAL | 11 refills | Status: AC

## 2017-11-13 MED ORDER — PIMAVANSERIN TARTRATE 34 MG PO CAPS: 1.0000 | ORAL_CAPSULE | Freq: Every day | ORAL | 3 refills | Status: AC

## 2017-11-13 MED ORDER — TRAZODONE HCL 50 MG PO TABS: ORAL_TABLET | ORAL | 3 refills | Status: DC

## 2017-11-13 MED ORDER — RISPERIDONE 0.25 MG PO TABS: ORAL_TABLET | ORAL | 3 refills | Status: DC

## 2017-11-13 NOTE — Progress Notes (Signed)
NEUROLOGY FOLLOW UP OFFICE NOTE  Candice Schultz [id number]  DOB: 11/01/1935  HISTORY OF PRESENT ILLNESS: I had the pleasure of seeing Candice Schultz in follow-up in the neurology clinic on 11/13/2017.  The patient was last seen 4 months ago for Lewy Body dementia with hallucinations and right hand tremor. She is again accompanied by her daughter who helps supplement the history today. Since her last visit, her daughter called our office to report progressive worsening. She had an infection and was treated with Levaquin, however behavior did not improve. She was not sleeping or taking daytime naps, despite taking Trazodone 100mg  qhs. On 2 occasions, she undressed in front of caregivers. Trazodone was increased to 150mg  qhs, her daughter reports only helped one night, this week has been more up and down. Her caregiver at night reported wandering. Her daughter mostly notices fiddling with the comforter, messing with the trim on her sheets. She wakes up 3-4 times at night. She is also taking Risperdal 0.25mg  BID which is helping some, she has anxiety about not being home, demanding to go outside her own home. She is on Nuplazid as well for hallucinations, they still occur occasionally, looking for other people in the room. She has 24/7 supervision, they help her with getting around as well due to worsening balance and bradykinesia. Caregivers provide all of her self care, she has forgotten how to use the toilet/wipe/clean herself.   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 She reports her memory is "sometimes excellent, sometimes not so good." She denies missing medications or missed bill payments, but her daughter states her husband had mentioned she paid a bill twice. Her family started noticing memory changes around 2 years ago. Her daughter has a list of several  concerning events. Last summer, she saw her parents prepare their pillboxes, and it took her mother 1-1/2 hours to put her medications in the weekly containing, repeatedly checking and re-checking. She has seen a couple of situations with money, one time she saw her mother in the nail salon being assisted by staff with counting money. Another time they were at the laboratory for blood draw with the front desk asking for payment, she kept looking at her card and staff had to repeat amount. Her daughter has noticed more difficulties with processing information. Last summer, she had written the wrong amount on a check and another time gave her the incorrect amount for her share in a gift they bought together. She is a good cook and good with recipes, but family has seen this dwindle, they are seeing signs that it is more difficult for her and she takes a longer time to cook things. She gets easily confused when on the phone. She used to use her laptop with emails and do puzzles in the newspaper, but the computer has become hard and very confusing for her and she does not attempt to do it anymore. Maintaining a calendar is a challenge, she puts the appointment in a different month. She would pick up someone's purse when she is at her daughter's house thinking it was her, which was not even similar to the one she brought in. Friends have noticed this as well and asked family what is going on. One time she saw friends at the store, and they felt they had to follow her to make sure she got  back to her husband fine. Her daughter has also noticed she is not as communicative, more quiet, with difficulty with her conversation skills. She would stop in the middle of a sentence and get lost, having to think of what she was saying or having difficulty finding the words. She denies getting lost driving, but her daughter reports one time where her husband had to get her and follow her home. There is no family history of dementia,  although her grandmother had memory problems and was diagnosed with a brain tumor. She denies any significant head injuries or alcohol intake.   Update 08/10/2016: Her daughter lives 2 doors down, she reports hallucinations that started a year ago. She has told her daughter she has seen and heard things, students sleep all over the house, in chairs, and have stolen the car and driven without permission. She asks her daughter what program these people are doing, "these students, I don't understand what they are doing." Most of the hallucinations appear visual, however they report some auditory hallucinations where "they said they own part of the deed." The patient states "those things are true." A year ago, she was frightened once hearing a voice at 1am, she opened the front door to figure out where the voices were coming from. Her daughter has not noticed her mother talking back to the hallucinations. On an almost daily basis, the patient asks where the other [name] is, "our daughter is the other [name]." She states there are people in the house right now, when they left the home, "they were friends of yours." Over the past 6-7 months, her daughter has also noticed a tremor in the right hand, initially occurring occasionally, but now almost daily for the past few months. They are worse when she is anxious or nervous. She has lost dexterity opening cans or using a knife with her fingers. She has noticed less fluidity in her mother's movements, she does not feel stiff, no falls. Her daughter is in charge of her parents' medications and food. She has been taking care of bills for the past 16 months. She can handle self-care by herself, but mostly needs help drying her hair or getting an arm in her coat/sweater. She was started on Seroquel for the hallucinations, this has helped her sleep better, but hallucinations continue. She did not tolerate BID dosing, and only takes it at night.   Diagnostic Data: B12 level  normal 776 I personally reviewed MRI brain with and without contrast which did not show any acute changes. There was mild diffuse atrophy, minimal chronic microvascular disease.   PAST MEDICAL HISTORY: Past Medical History:  Diagnosis Date  . AF (atrial fibrillation) ([place])   . Anxiety   . Cancer ([facility])    breast  . Chest pain   . Constipation   . Dementia    mild  . Family history of adverse reaction to anesthesia    " my daughter"  . Heart palpitations    related for flecainide and due to having afib   . Hyperlipidemia   . Hypertension     MEDICATIONS:  Outpatient Encounter Medications as of 11/13/2017  Medication Sig  . acetaminophen (TYLENOL) 500 MG tablet Take 500 mg by mouth every 6 (six) hours as needed for headache.  . Calcium Citrate-Vitamin D (CALCIUM CITRATE + D PO) Take 1 tablet by mouth 2 (two) times daily.  Marland Kitchen DIGOX 125 MCG tablet TAKE 1 TABLET BY MOUTH EVERY DAY  . docusate sodium (COLACE) 100  MG capsule Take 100 mg by mouth 2 (two) times daily.  Marland Kitchen ELIQUIS 2.5 MG TABS tablet TAKE 1 TABLET BY MOUTH TWICE DAILY  . flecainide (TAMBOCOR) 100 MG tablet TAKE 1 TABLET BY MOUTH TWICE DAILY  . hydroxypropyl methylcellulose / hypromellose (ISOPTO TEARS / GONIOVISC) 2.5 % ophthalmic solution Place 1 drop into both eyes 3 (three) times daily as needed for dry eyes.  Marland Kitchen LORazepam (ATIVAN) 0.5 MG tablet Take 0.25-0.5 mg by mouth every 6 (six) hours as needed for anxiety.   . metoprolol succinate (TOPROL-XL) 25 MG 24 hr tablet TAKE 1 TABLET(25 MG) BY MOUTH DAILY  . Multiple Vitamin (MULTIVITAMIN) tablet Take 1 tablet by mouth daily.   Marland Kitchen olopatadine (PATANOL) 0.1 % ophthalmic solution   . Pimavanserin Tartrate (NUPLAZID) 34 MG CAPS Take 1 capsule by mouth daily.  . risperiDONE (RISPERDAL) 0.25 MG tablet TAKE 1 TABLET BY MOUTH AS NEEDED FOR AGITATION. MAY TAKE 2ND TABLET AS NEEDED (Patient taking 1 tablet twice a day)  . traZODone (DESYREL) 50 MG tablet Take 2 tablets by mouth each  night at bedtime (Patient taking 3 tablets each night)   No facility-administered encounter medications on file as of 11/13/2017.     ALLERGIES: Allergies  Allergen Reactions  . Meperidine     Other reaction(s): nausea  . Midazolam     Other reaction(s): nausea  . Pravastatin Other (See Comments)    Memory Issues  . Propafenone Hcl Er   . Sulfa Antibiotics     Other reaction(s): nausea   . Penicillins Rash        FAMILY HISTORY: Family History  Problem Relation Age of Onset  . Cancer Mother        Brain  . Cancer Sister        Breast  . Cancer - Other Brother     SOCIAL HISTORY: Social History   Socioeconomic History  . Marital status: Married    Spouse name: Not on file  . Number of children: 2  . Years of education: Not on file  . Highest education level: Not on file  Occupational History  . Occupation: Retired  Scientific laboratory technician  . Financial resource strain: Not on file  . Food insecurity:    Worry: Not on file    Inability: Not on file  . Transportation needs:    Medical: Not on file    Non-medical: Not on file  Tobacco Use  . Smoking status: Former Smoker    Last attempt to quit: 02/27/1983    Years since quitting: 34.7  . Smokeless tobacco: Never Used  Substance and Sexual Activity  . Alcohol use: No    Alcohol/week: 0.0 standard drinks  . Drug use: No  . Sexual activity: Not on file  Lifestyle  . Physical activity:    Days per week: Not on file    Minutes per session: Not on file  . Stress: Not on file  Relationships  . Social connections:    Talks on phone: Not on file    Gets together: Not on file    Attends religious service: Not on file    Active member of club or organization: Not on file    Attends meetings of clubs or organizations: Not on file    Relationship status: Not on file  . Intimate partner violence:    Fear of current or ex partner: Not on file    Emotionally abused: Not on file    Physically abused: Not on  file    Forced  sexual activity: Not on file  Other Topics Concern  . Not on file  Social History Narrative  . Not on file    REVIEW OF SYSTEMS: Constitutional: No fevers, chills, or sweats, no generalized fatigue, change in appetite Eyes: No visual changes, double vision, eye pain Ear, nose and throat: No hearing loss, ear pain, nasal congestion, sore throat Cardiovascular: No chest pain, palpitations Respiratory:  No shortness of breath at rest or with exertion, wheezes GastrointestinaI: No nausea, vomiting, diarrhea, abdominal pain, fecal incontinence Genitourinary:  No dysuria, urinary retention or frequency Musculoskeletal:  No neck pain, back pain Integumentary: No rash, pruritus, skin lesions Neurological: as above Psychiatric: No depression, insomnia, anxiety Endocrine: No palpitations, fatigue, diaphoresis, mood swings, change in appetite, change in weight, increased thirst Hematologic/Lymphatic:  No anemia, purpura, petechiae. Allergic/Immunologic: no itchy/runny eyes, nasal congestion, recent allergic reactions, rashes  PHYSICAL EXAM: Vitals:   11/13/17 1527  BP: 130/64  Pulse: (!) 59  SpO2: 98%   General: No acute distress, facial hypomimia Head:  Normocephalic/atraumatic Neck: supple, no paraspinal tenderness, full range of motion Back: No paraspinal tenderness Heart: regular rate and rhythm Lungs: Clear to auscultation bilaterally. Vascular: No carotid bruits. Skin/Extremities: No rash, no edema Neurological Exam: Mental status: alert and oriented to person. More difficulties following commands such as with finger to nose testing. No dysarthria or aphasia, Fund of knowledge is reduced.  Recent and remote memory impaired. 0/3 delayed recall. Attention and concentration are reduced. Able to name objects, difficulty with repetition.  Cranial nerves: CN I: not tested CN II: pupils equal, round and reactive to light, difficulty with following commands but appeared to have visual  extinction on right CN III, IV, VI:  full range of motion, no nystagmus, no ptosis CN V: facial sensation intact CN VII: upper and lower face symmetric CN VIII: hearing intact to conversation CN XI: sternocleidomastoid and trapezius muscles intact CN XII: tongue midline Bulk & Tone: bradykinetic, +right cogwheeling, no fasciculations. Motor: 5/5 throughout with no pronator drift. Sensation: intact to light touch.  No extinction to double simultaneous stimulation.  Romberg test negative Deep Tendon Reflexes: +2 throughout except for absent ankle jerks bilaterally, no ankle clonus Plantar responses: downgoing bilaterally Cerebellar: no incoordination on finger to nose testing Gait: not tested, sitting in wheelchair, usually needs 1-2 person assist with ambulation now Tremor: right hand resting tremor (similar to prior), no postural or action tremor seen  IMPRESSION: This is a 83 yo RH woman with a history of breast cancer s/p lumpectomy, atrial fibrillation on chronic anticoagulation, who initially presented with worsening memory that progressed to visual and auditory hallucinations, as well as a parkinsonian tremor on the right hand. Symptoms suggestive of Lewy Body dementia. Her daughter is reporting worsening sleep, more behavioral changes (picking behaviors), on Trazodone 150mg  qhs and Risperdal 0.25mg  BID. We discussed adding on Wellbutrin 75mg  qhs. Side effects discussed. Her daughter also reports more difficulty with ambulation, she is more bradykinetic and may benefit from a trial of Sinemet. Continue Nuplazid for hallucinations. Continue 24/7 care. She will follow-up as scheduled in December 2019, her daughter knows to call for any changes.    Thank you for allowing me to participate in her care.  Please do not hesitate to call for any questions or concerns.  The duration of this appointment visit was 26 minutes of face-to-face time with the patient.  Greater than 50% of this time was  spent in counseling, explanation of diagnosis,  planning of further management, and coordination of care.   Ellouise Newer, M.D.   CC: Dr. Laurann Montana

## 2017-11-13 NOTE — Patient Instructions (Signed)
1. Start Wellbutrin 75mg  every night 2. After a month, start Sinemet 25/100mg : Take 1/2 tablet three times a day with meals, then after a week increase to 1 tablet three times a day with meals 3. Continue Trazodone 50mg , take 3 tablets every night, Risperdal 0.25mg  twice a day, and Nuplazid daily 4. Continue 24/7 care 5. Follow-up as scheduled in December

## 2017-11-19 ENCOUNTER — Encounter: Payer: Self-pay | Admitting: Cardiovascular Disease

## 2017-11-19 ENCOUNTER — Telehealth: Payer: Self-pay | Admitting: Cardiovascular Disease

## 2017-11-19 NOTE — Telephone Encounter (Signed)
Left detailed message on daughter's VM (DPR) that I am not sure if anything is needed from nursing/triage but if so to please call back.  Pt had appointment (yearly) with Dr. Acie Fredrickson on 11/21/17 and today this was rescheduled to 12/30/17 due to the patient is sick. Will route to Dr. Elmarie Shiley nurse to make aware.

## 2017-11-19 NOTE — Telephone Encounter (Signed)
New Message:     Patient daughter has questions about what the next step in treatment would be for her mother.

## 2017-11-19 NOTE — Telephone Encounter (Signed)
Medication change last week with neurologist. Sent her to a new other realm with her lewy body dementia.  Med was stopped but she is not back to her normal self. Her activity level has declined significantly.  "What in the world do we do when we can no longer bring her to see Dr. Acie Fredrickson."  Advised there are possible home care options that can be in place for assessment purposes if her PCP can put into place.  In the meantime, she will plan to bring her to Dr. Acie Fredrickson appointment on 12/30/17.

## 2017-11-21 ENCOUNTER — Ambulatory Visit: Payer: Medicare Other | Admitting: Cardiovascular Disease

## 2017-11-28 ENCOUNTER — Telehealth: Payer: Self-pay | Admitting: Neurology

## 2017-11-28 NOTE — Telephone Encounter (Signed)
Patient's daughter called and was concerned about her medication Wellbutrin. She feels that it is causing her to have Joint pain in her Right hip. She said that is a side effect of the medication. She said they are unable to stand her up and she is not wanting to stand or move. Her daughter said that she will not give her the medication tonight and wait until she hears from our office. Please Call. Thanks

## 2017-11-29 NOTE — Telephone Encounter (Signed)
Patient's daughter is calling back again regarding previous message. Please call her back at [phone]. Thanks!

## 2017-11-29 NOTE — Telephone Encounter (Signed)
Spoke with pt's daughter, [name], advising her to d/c Wellbutrin per Dr. Delice Lesch

## 2017-12-04 ENCOUNTER — Encounter (HOSPITAL_COMMUNITY): Payer: Self-pay | Admitting: Emergency Medicine

## 2017-12-04 ENCOUNTER — Emergency Department (HOSPITAL_COMMUNITY)
Admission: EM | Admit: 2017-12-04 | Discharge: 2017-12-04 | Discharge status: Against Medical Advice | Payer: Medicare Other | Attending: Emergency Medicine | Admitting: Emergency Medicine

## 2017-12-04 ENCOUNTER — Other Ambulatory Visit: Payer: Self-pay

## 2017-12-04 DIAGNOSIS — I959 Hypotension, unspecified: Secondary | ICD-10-CM | POA: Insufficient documentation

## 2017-12-04 DIAGNOSIS — Z5321 Procedure and treatment not carried out due to patient leaving prior to being seen by health care provider: Secondary | ICD-10-CM | POA: Insufficient documentation

## 2017-12-04 LAB — BASIC METABOLIC PANEL
Anion gap: 10 (ref 5–15)
BUN: 23 mg/dL (ref 8–23)
CALCIUM: 9.5 mg/dL (ref 8.9–10.3)
CHLORIDE: 95 mmol/L — AB (ref 98–111)
CO2: 26 mmol/L (ref 22–32)
CREATININE: 1.08 mg/dL — AB (ref 0.44–1.00)
GFR calc non Af Amer: 47 mL/min — ABNORMAL LOW (ref >60)
GFR, EST AFRICAN AMERICAN: 54 mL/min — AB (ref >60)
GLUCOSE: 125 mg/dL — AB (ref 70–99)
Potassium: 4.2 mmol/L (ref 3.5–5.1)
Sodium: 131 mmol/L — ABNORMAL LOW (ref 135–145)

## 2017-12-04 LAB — CBC
HEMATOCRIT: 36 % (ref 36.0–46.0)
Hemoglobin: 11.7 g/dL — ABNORMAL LOW (ref 12.0–15.0)
MCH: 30.5 pg (ref 26.0–34.0)
MCHC: 32.5 g/dL (ref 30.0–36.0)
MCV: 93.8 fL (ref 80.0–100.0)
NRBC: 0 % (ref 0.0–0.2)
Platelets: 441 10*3/uL — ABNORMAL HIGH (ref 150–400)
RBC: 3.84 MIL/uL — ABNORMAL LOW (ref 3.87–5.11)
RDW: 11.4 % — ABNORMAL LOW (ref 11.5–15.5)
WBC: 7.2 10*3/uL (ref 4.0–10.5)

## 2017-12-04 NOTE — ED Triage Notes (Signed)
Patient went to the doctor today to check urine being dark in color. While at office BP 84/51. Sent to the ED for evaluation. Caregiver a family friend states urine was dark in color for 2 weeks and sleeping a lot. Patient alert history of dementia.

## 2017-12-04 NOTE — ED Notes (Signed)
Advised pt not to leave but insisted that the patient was getting restless and ready to go home.

## 2017-12-05 IMAGING — CT CT CHEST W/O CM
3 of 4 series · 17 of 30 positions shown, 19 images · non-contrast
Comparison: 02/12/2014

CLINICAL DATA: Follow-up lung nodule. Nonsmoker since 5016. History
of right breast cancer.

EXAM:
CT CHEST WITHOUT CONTRAST
TECHNIQUE: Multidetector CT imaging of the chest was performed following the
standard protocol without IV contrast.

[Series 3: chest w/o · axial · non-contrast · 0.64mm/px · z∈[-234,-9]mm · 5 of 69 slices shown, 7 images]
[im 12/69  mediastinal]
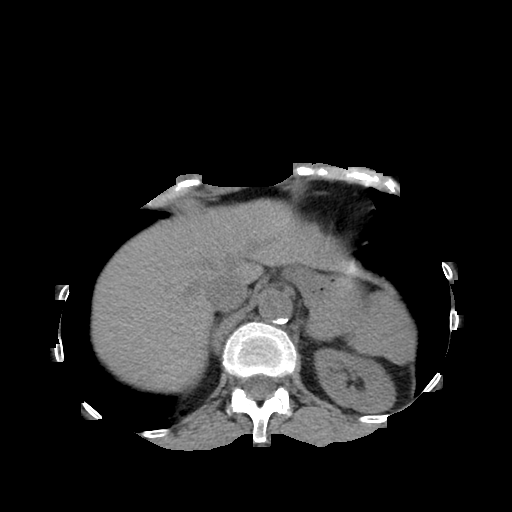
[im 12/69  lung]
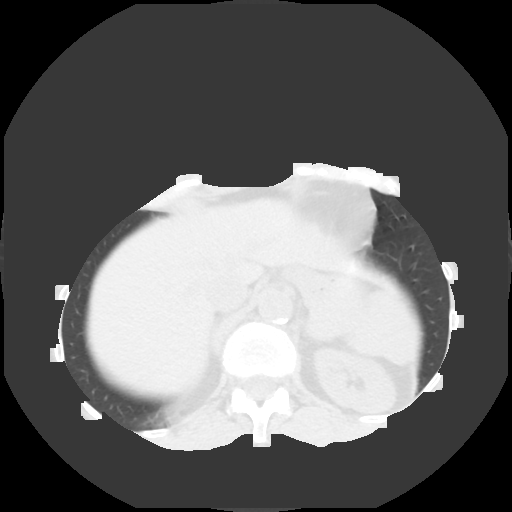
[im 23/69  lung]
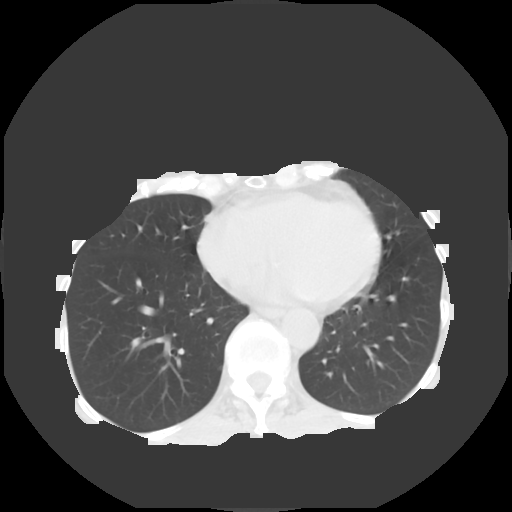
[im 35/69  lung]
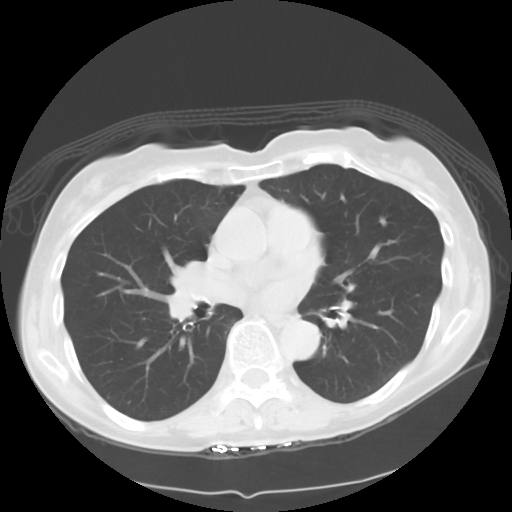
[im 46/69  lung]
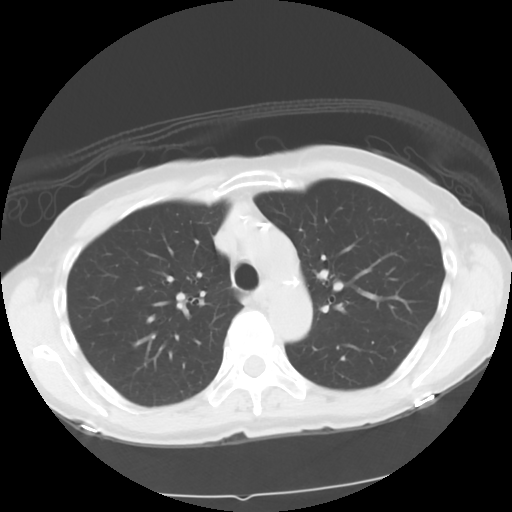
[im 57/69  mediastinal]
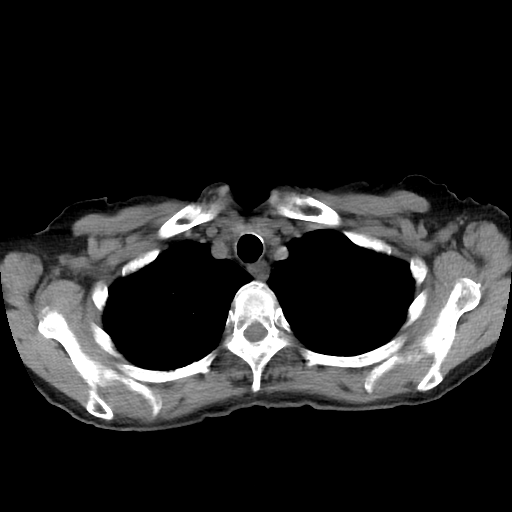
[im 57/69  lung]
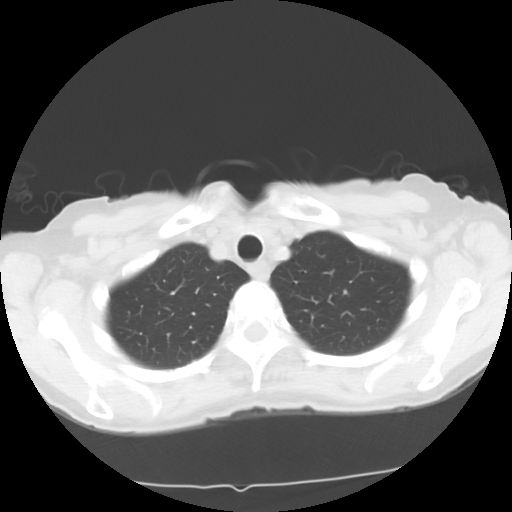

[Series 4: lung windows · axial · 0.64mm/px · z∈[-234,-9]mm · 5 of 69 slices shown]
[im 12/69  lung]
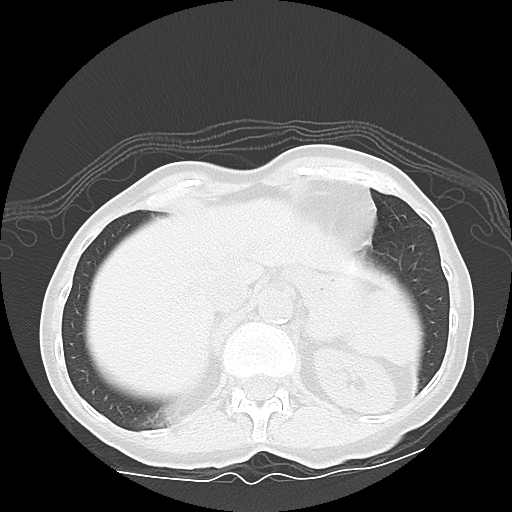
[im 23/69  lung]
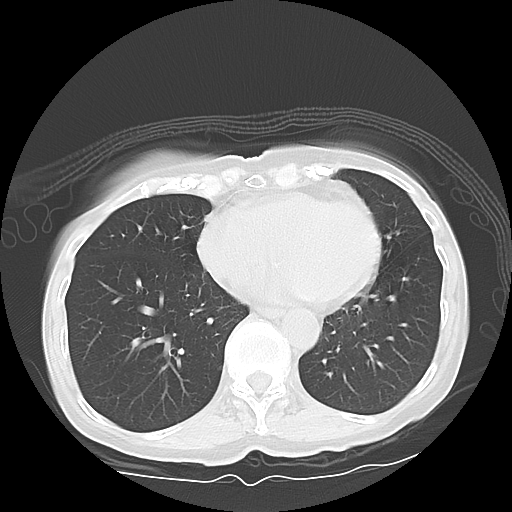
[im 35/69  lung]
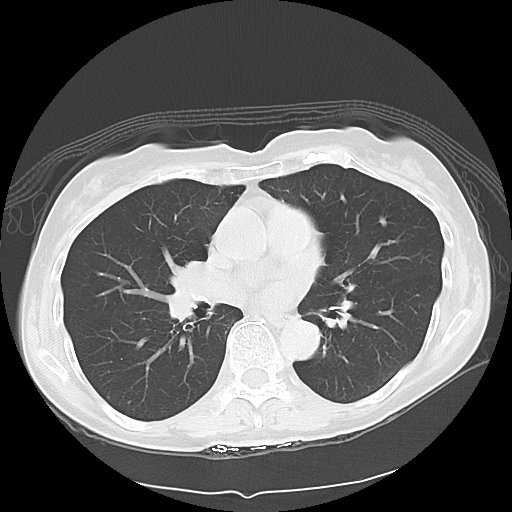
[im 46/69  lung]
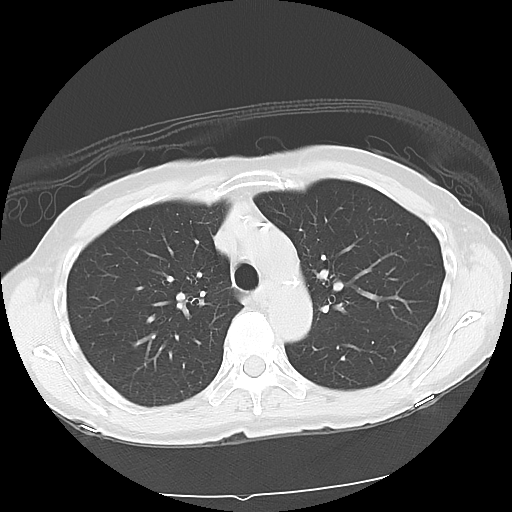
[im 57/69  lung]
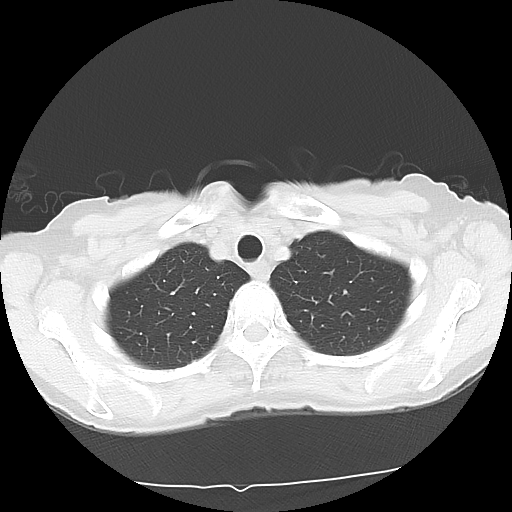

[Series 602: sagittal body · sagittal · 0.67mm/px · 7 of 133 slices shown]
[im 11/133  mediastinal]
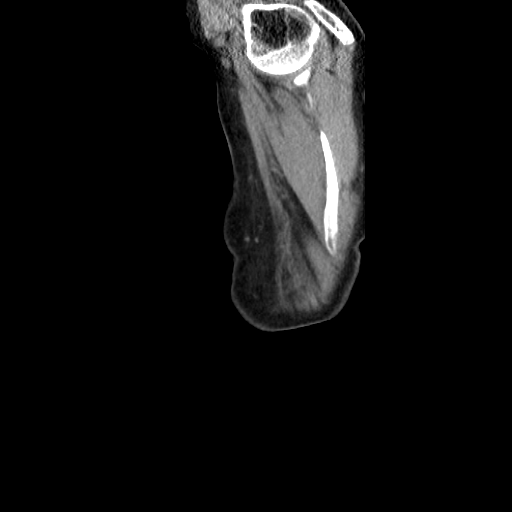
[im 31/133  mediastinal]
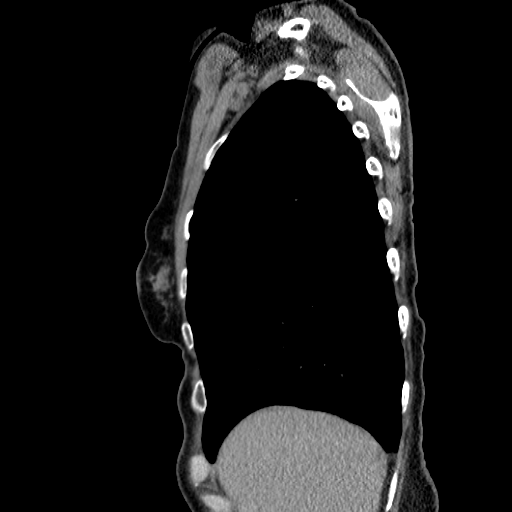
[im 41/133  mediastinal]
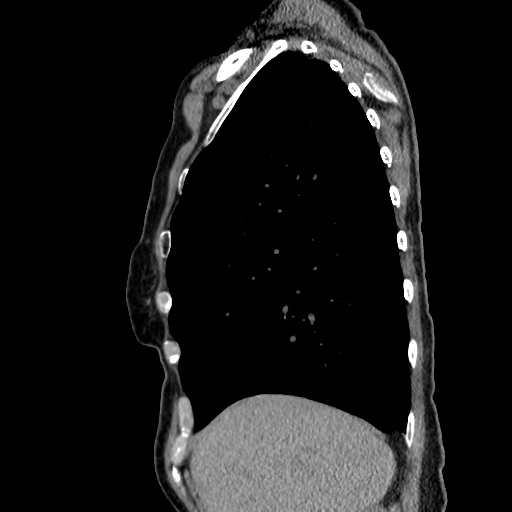
[im 61/133  mediastinal]
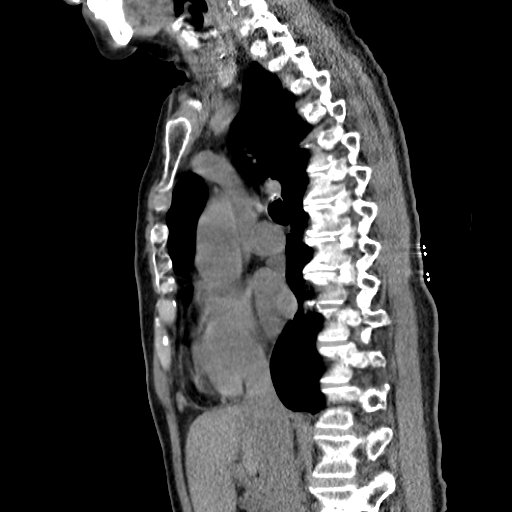
[im 72/133  mediastinal]
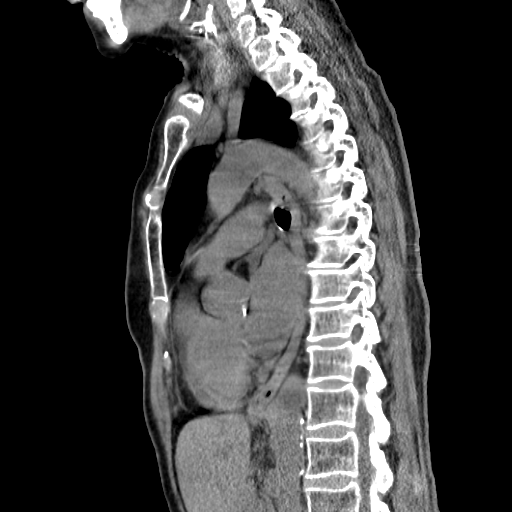
[im 92/133  mediastinal]
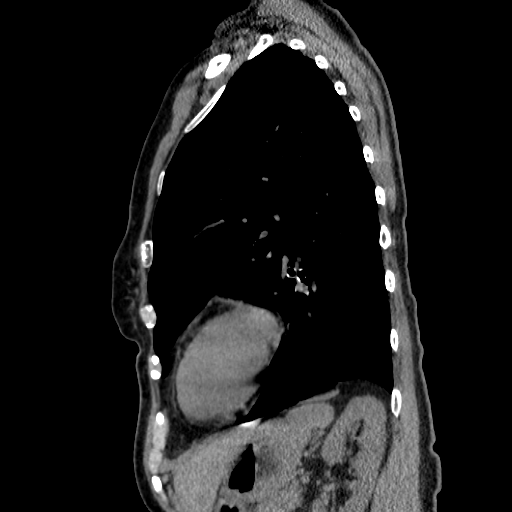
[im 102/133  mediastinal]
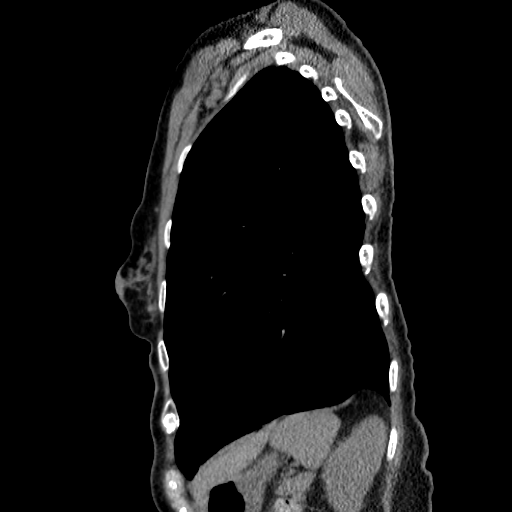

[17 of 30 positions shown; findings below may reference images not displayed]

FINDINGS: Mediastinum/Lymph Nodes: No chest lymphadenopathy. Atherosclerotic
calcifications involving the aorta and great vessels. Mid ascending
thoracic aorta measures 3.3 cm in diameter and stable. No
significant pericardial fluid. No axillary lymphadenopathy.

Lungs/Pleura: Trachea and mainstem bronchi are patent. Stable 3-4 mm
nodule pleural-based nodule in the medial right lower lobe. In
addition, there may be a small focus of calcium in this nodule.
There is a subtle broad-based nodular density along the right
hemidiaphragm roughly measuring 5-6 mm. This is also unchanged from
the prior examination. No significant airspace disease or lung
consolidation.

Upper abdomen: Images of the upper abdomen are unremarkable.

Musculoskeletal: No chest wall mass or suspicious bone lesions
identified.
IMPRESSION: Stable small pleural-based nodules in the right lower lobe. If the
patient is low risk for malignancy, the patient does not require
additional follow-up of these nodules. If the patient is high risk,
recommend an additional 12 month follow-up to ensure 2 years of
stability. This recommendation follows the consensus statement:
Guidelines for Management of Small Pulmonary Nodules Detected on CT
Scans: A Statement from the [HOSPITAL] as published in

No acute chest findings.

## 2017-12-09 ENCOUNTER — Telehealth: Payer: Self-pay | Admitting: Cardiovascular Disease

## 2017-12-09 NOTE — Telephone Encounter (Signed)
Spoke with patient's daughter, [name], who states patient is having low BP. States she was sent to ED last week for low BP of 80/50 after going to a walk-in clinic for suspected UTI. Beverly states patient's BP normalized to 123/64 mmHg x 3 checks after waiting in ED for 6.5-7 hours so they left and went home without being seen. She reports today patient c/o headache, states she did not sleep all night last night and was thrashing about. Patient is now awake and has had breakfast but no medications this morning. States BP low again today, 84/55 mmHg. Risperdal has hypotension listed as adverse reaction but daughter states patient only takes this rarely. While on the phone with me, [name] received a call from Dr. Delene Ruffini office and I let her take it and then called back a little later.  Patient has an appointment with Dr. Laurann Montana, PCP today at 11:30 am. [name] thanked me for the call back and will follow-up in the future as needed.

## 2017-12-09 NOTE — Telephone Encounter (Signed)
New Message:     Pt c/o BP issue: STAT if pt c/o blurred vision, one-sided weakness or slurred speech  1. What are your last 5 BP readings? 84/51  83/55 (Today)   2. Are you having any other symptoms (ex. Dizziness, headache, blurred vision, passed out)? No   3. What is your BP issue? Low BP

## 2017-12-10 ENCOUNTER — Telehealth: Payer: Self-pay | Admitting: Neurology

## 2017-12-10 ENCOUNTER — Telehealth: Payer: Self-pay | Admitting: Cardiovascular Disease

## 2017-12-10 NOTE — Telephone Encounter (Signed)
Error no note needed °

## 2017-12-10 NOTE — Telephone Encounter (Signed)
Patient's daughter is calling in wanting to speak with the nurse about some things going on with her medication. She is having some side effects. She just wanted to get some advice from the nurse about what to do. Please call her back at [phone]. Thanks!

## 2017-12-10 NOTE — Telephone Encounter (Signed)
Spoke with pt's daughter.  She states that pt was seen in ED last week for hypotension.  PCP advised that Trazodone might contribute to low BP and had pt lower dose from 150mg  to 50mg .  [name] states that pt is now not sleeping again.  Pt has appt with cards tomorrow.  I advised to increase trazodone to 75mg  tonight and consider OTC Melatonin.  Warner Mccreedy to speak with cards tomorrow about Trazodone dose since pt has been tolerating it for over a month.

## 2017-12-11 ENCOUNTER — Encounter: Payer: Self-pay | Admitting: Medical

## 2017-12-11 ENCOUNTER — Telehealth: Payer: Self-pay | Admitting: Neurology

## 2017-12-11 ENCOUNTER — Other Ambulatory Visit: Payer: Self-pay

## 2017-12-11 ENCOUNTER — Encounter (HOSPITAL_COMMUNITY): Payer: Self-pay

## 2017-12-11 ENCOUNTER — Emergency Department (HOSPITAL_COMMUNITY)
Admission: EM | Admit: 2017-12-11 | Discharge: 2017-12-11 | Disposition: A | Discharge status: Home / Self Care | Payer: Medicare Other | Attending: Emergency Medicine | Admitting: Emergency Medicine

## 2017-12-11 ENCOUNTER — Ambulatory Visit: Payer: Medicare Other | Admitting: Medical

## 2017-12-11 VITALS — BP 76/36 | HR 55 | Ht 66.0 in | Wt 125.0 lb

## 2017-12-11 DIAGNOSIS — I480 Paroxysmal atrial fibrillation: Secondary | ICD-10-CM | POA: Diagnosis not present

## 2017-12-11 DIAGNOSIS — Z87891 Personal history of nicotine dependence: Secondary | ICD-10-CM | POA: Diagnosis not present

## 2017-12-11 DIAGNOSIS — Z79899 Other long term (current) drug therapy: Secondary | ICD-10-CM | POA: Insufficient documentation

## 2017-12-11 DIAGNOSIS — F0281 Dementia in other diseases classified elsewhere with behavioral disturbance: Secondary | ICD-10-CM | POA: Diagnosis not present

## 2017-12-11 DIAGNOSIS — I959 Hypotension, unspecified: Secondary | ICD-10-CM | POA: Diagnosis not present

## 2017-12-11 DIAGNOSIS — F02818 Dementia in other diseases classified elsewhere, unspecified severity, with other behavioral disturbance: Secondary | ICD-10-CM

## 2017-12-11 DIAGNOSIS — I10 Essential (primary) hypertension: Secondary | ICD-10-CM | POA: Diagnosis not present

## 2017-12-11 DIAGNOSIS — F0390 Unspecified dementia without behavioral disturbance: Secondary | ICD-10-CM | POA: Diagnosis not present

## 2017-12-11 DIAGNOSIS — G3183 Dementia with Lewy bodies: Secondary | ICD-10-CM | POA: Diagnosis not present

## 2017-12-11 HISTORY — DX: Dementia with Lewy bodies: G31.83

## 2017-12-11 HISTORY — DX: Dementia in other diseases classified elsewhere, unspecified severity, without behavioral disturbance, psychotic disturbance, mood disturbance, and anxiety: F02.80

## 2017-12-11 LAB — CBC WITH DIFFERENTIAL/PLATELET
ABS IMMATURE GRANULOCYTES: 0.04 10*3/uL (ref 0.00–0.07)
BASOS PCT: 0 %
Basophils Absolute: 0 10*3/uL (ref 0.0–0.1)
EOS ABS: 0.1 10*3/uL (ref 0.0–0.5)
Eosinophils Relative: 1 %
HCT: 37.8 % (ref 36.0–46.0)
Hemoglobin: 12.4 g/dL (ref 12.0–15.0)
Immature Granulocytes: 1 %
LYMPHS ABS: 1.1 10*3/uL (ref 0.7–4.0)
LYMPHS PCT: 15 %
MCH: 30.8 pg (ref 26.0–34.0)
MCHC: 32.8 g/dL (ref 30.0–36.0)
MCV: 94 fL (ref 80.0–100.0)
Monocytes Absolute: 0.8 10*3/uL (ref 0.1–1.0)
Monocytes Relative: 11 %
NEUTROS PCT: 72 %
NRBC: 0 % (ref 0.0–0.2)
Neutro Abs: 5.1 10*3/uL (ref 1.7–7.7)
PLATELETS: 439 10*3/uL — AB (ref 150–400)
RBC: 4.02 MIL/uL (ref 3.87–5.11)
RDW: 11.6 % (ref 11.5–15.5)
WBC: 7.1 10*3/uL (ref 4.0–10.5)

## 2017-12-11 LAB — URINALYSIS, ROUTINE W REFLEX MICROSCOPIC
BILIRUBIN URINE: NEGATIVE
GLUCOSE, UA: NEGATIVE mg/dL
HGB URINE DIPSTICK: NEGATIVE
Ketones, ur: 5 mg/dL — AB
Leukocytes, UA: NEGATIVE
Nitrite: NEGATIVE
PROTEIN: NEGATIVE mg/dL
Specific Gravity, Urine: 1.015 (ref 1.005–1.030)
pH: 6 (ref 5.0–8.0)

## 2017-12-11 LAB — COMPREHENSIVE METABOLIC PANEL
ALT: 26 U/L (ref 0–44)
ANION GAP: 10 (ref 5–15)
AST: 28 U/L (ref 15–41)
Albumin: 3.5 g/dL (ref 3.5–5.0)
Alkaline Phosphatase: 66 U/L (ref 38–126)
BUN: 14 mg/dL (ref 8–23)
CO2: 24 mmol/L (ref 22–32)
Calcium: 9 mg/dL (ref 8.9–10.3)
Chloride: 97 mmol/L — ABNORMAL LOW (ref 98–111)
Creatinine, Ser: 0.85 mg/dL (ref 0.44–1.00)
GFR calc non Af Amer: >60 mL/min (ref >60)
Glucose, Bld: 101 mg/dL — ABNORMAL HIGH (ref 70–99)
Potassium: 4.2 mmol/L (ref 3.5–5.1)
SODIUM: 131 mmol/L — AB (ref 135–145)
Total Bilirubin: 0.7 mg/dL (ref 0.3–1.2)
Total Protein: 7.2 g/dL (ref 6.5–8.1)

## 2017-12-11 LAB — I-STAT CG4 LACTIC ACID, ED: Lactic Acid, Venous: 0.94 mmol/L (ref 0.5–1.9)

## 2017-12-11 MED ORDER — DOXEPIN HCL 3 MG PO TABS: 1.0000 | ORAL_TABLET | Freq: Every day | ORAL | 1 refills | Status: DC

## 2017-12-11 NOTE — Telephone Encounter (Signed)
I looked at Cards and ED notes and did not see where pt was told to D/c Trazodone.  pls advise.

## 2017-12-11 NOTE — Telephone Encounter (Signed)
Patient's daughter confirmed pharmacy of [facility] on [address]. In [place]. Please send the Doxepin medication there please.

## 2017-12-11 NOTE — ED Notes (Signed)
Got patient undress on the monitor help do in and out cath on patient now patient is resting with family at bedside and call bell in reach

## 2017-12-11 NOTE — ED Notes (Signed)
Discharge paperwork reviewed with pts daughter and caregiver. Verbalized understanding.

## 2017-12-11 NOTE — ED Triage Notes (Signed)
Pt here from [facility] due to low BP reading at home, it was 71/46 this morning when checked by family and around the same in the office at [facility]. 136/60 in triage. Pt being treated for UTI, started Cipro last night. Normally takes metoprolol but has not had it today. Family says that pt is at her baseline at this time. VSS. Has end stage lewey body dementia.

## 2017-12-11 NOTE — Patient Instructions (Signed)
Medication Instructions:  1. STOP DIGOXIN If you need a refill on your cardiac medications before your next appointment, please call your pharmacy.   Lab work: NONE ORDERED TODAY If you have labs (blood work) drawn today and your tests are completely normal, you will receive your results only by: Marland Kitchen MyChart Message (if you have MyChart) OR . A paper copy in the mail If you have any lab test that is abnormal or we need to change your treatment, we will call you to review the results.  Testing/Procedures: NONE ORDERED TODAY  Follow-Up: At [facility], you and your health needs are our priority.  As part of our continuing mission to provide you with exceptional heart care, we have created designated Provider Care Teams.  These Care Teams include your primary Cardiologist (physician) and Advanced Practice Providers (APPs -  Physician Assistants and Nurse Practitioners) who all work together to provide you with the care you need, when you need it. You will need a follow up appointment in:  3 months.  Please call our office 2 months in advance to schedule this appointment.  You may see [name], MD ON 03/24/18 @ 10:20 AM or one of the following Advanced Practice Providers on your designated Care Team: Richardson Dopp, PA-C [place], [place] . Daune Perch, NP  Any Other Special Instructions Will Be Listed Below (If Applicable). YOU HAVE BEEN ADVISED TO GO TO [facility] TODAY FOR HYPOTENSION.

## 2017-12-11 NOTE — Telephone Encounter (Signed)
Patient's daughter called regarding needing to speak with you regarding her mother Candice Schultz. She said today they have been to [facility] and to the ED due to her blood pressure dropping low and the getting higher. She was taken off her Trazodone? Her daughter would like you to call her regarding night medication for sleeping. Thanks

## 2017-12-11 NOTE — Telephone Encounter (Signed)
RX sent

## 2017-12-11 NOTE — Telephone Encounter (Signed)
Pls let daughter know I'm not quite sure it is the Trazodone causing the low BP. Did she stop it at all and did stopping it help with BP? We can try a different medication, Doxepin 3mg  every night. This may make her very drowsy, but if it helps sleep, we can try. It does not typically cause low BP. Thanks

## 2017-12-11 NOTE — ED Provider Notes (Signed)
[facility] EMERGENCY DEPARTMENT Provider Note   CSN: [id number] Arrival date & time: 12/11/17  1125   LEVEL 5 CAVEAT - DEMENTIA   History   Chief Complaint Chief Complaint  Patient presents with  . Hypotension    HPI Candice Schultz is a 83 y.o. female.  HPI  83 year old female with a significant history of dementia presents with hypotension.  History is taken from the daughter at the bedside.  Patient had multiple blood pressures in the 70s this morning, once at home and as well as at the doctor's office.  Was sent here for further eval.  Besides her issues with dementia, she has not been acting differently or having any acute illness.  No fevers or vomiting recently.  She was started on Cipro 2 days ago for urinary tract infection.  She has not taken any of her medicines this morning due to the low blood pressure.  Had similar episode of low blood pressure 1 week ago and came to the ER but her blood pressures normalized while in the waiting room and they left due to the long wait.  She is been complaining of some body aches/joint pain whenever people pick her up or help her get up for the last few weeks that was attributed to Wellbutrin and this is since been stopped.  Past Medical History:  Diagnosis Date  . AF (atrial fibrillation) ([place])   . Anxiety   . Cancer (HCC)    breast  . Chest pain   . Constipation   . Dementia ([place])    mild  . Family history of adverse reaction to anesthesia    " my daughter"  . Heart palpitations    related for flecainide and due to having afib   . Hyperlipidemia   . Hypertension   . Lewy body dementia [facility])     Patient Active Problem List   Diagnosis Date Noted  . Hyponatremia 09/03/2016  . Sinus bradycardia on ECG 09/03/2016  . Bradycardia   . Essential hypertension   . Lewy body dementia with behavioral disturbance ([place]) 08/15/2016  . Mitral valve prolapse 10/25/2015  . Mild dementia ([place]) 02/25/2015  . Encounter for  therapeutic drug monitoring 04/24/2013  . History of breast cancer 12/11/2010  . Chest pain   . Hyperlipidemia   . Atrial fibrillation ([place]) 06/06/2010  . Long term current use of anticoagulant 06/06/2010    Past Surgical History:  Procedure Laterality Date  . ABDOMINAL HYSTERECTOMY    . APPENDECTOMY    . BREAST BIOPSY    . CARDIAC CATHETERIZATION  08/2008   NORMAL CORONARY ARTERIES     OB History   None      Home Medications    Prior to Admission medications   Medication Sig Start Date End Date Taking? Authorizing Provider  acetaminophen (TYLENOL) 500 MG tablet Take 500 mg by mouth every 6 (six) hours as needed for headache.    [provider]  Calcium Citrate-Vitamin D (CALCIUM CITRATE + D PO) Take 1 tablet by mouth 2 (two) times daily.    [provider]  carbidopa-levodopa (SINEMET IR) 25-100 MG tablet Take 1/2 tablet three times a day with meals, then after a week increase to 1 tablet three times a day with meals 11/13/17   Cameron Sprang, MD  ciprofloxacin (CIPRO) 250 MG tablet Take 1 tablet by mouth 2 (two) times daily. 12/09/17   [provider]  docusate sodium (COLACE) 100 MG capsule Take  100 mg by mouth 2 (two) times daily.    [provider]  Doxepin HCl 3 MG TABS Take 1 tablet (3 mg total) by mouth at bedtime. 12/11/17   Cameron Sprang, MD  ELIQUIS 2.5 MG TABS tablet TAKE 1 TABLET BY MOUTH TWICE DAILY 05/23/17   Nahser, Wonda Cheng, MD  flecainide (TAMBOCOR) 100 MG tablet TAKE 1 TABLET BY MOUTH TWICE DAILY 10/09/17   Nahser, Wonda Cheng, MD  hydroxypropyl methylcellulose / hypromellose (ISOPTO TEARS / GONIOVISC) 2.5 % ophthalmic solution Place 1 drop into both eyes 3 (three) times daily as needed for dry eyes.    [provider]  metoprolol succinate (TOPROL-XL) 25 MG 24 hr tablet TAKE 1 TABLET(25 MG) BY MOUTH DAILY 04/01/17   Nahser, Wonda Cheng, MD  Multiple Vitamin (MULTIVITAMIN) tablet Take 1 tablet by mouth daily.     [provider]  olopatadine (PATANOL) 0.1 % ophthalmic solution  07/08/17   [provider]  Pimavanserin Tartrate (NUPLAZID) 34 MG CAPS Take 1 capsule by mouth daily. 11/13/17   Cameron Sprang, MD  risperiDONE (RISPERDAL) 0.25 MG tablet Take 0.25 mg by mouth daily as needed (anxiety).    [provider]  traZODone (DESYREL) 50 MG tablet Take 75 mg by mouth at bedtime.    [provider]    Family History Family History  Problem Relation Age of Onset  . Cancer Mother        Brain  . Cancer Sister        Breast  . Cancer - Other Brother     Social History Social History   Tobacco Use  . Smoking status: Former Smoker    Last attempt to quit: 02/27/1983    Years since quitting: 34.8  . Smokeless tobacco: Never Used  Substance Use Topics  . Alcohol use: No    Alcohol/week: 0.0 standard drinks  . Drug use: No     Allergies   Meperidine; Midazolam; Pravastatin; Propafenone hcl er; Sulfa antibiotics; and Penicillins   Review of Systems Review of Systems  Unable to perform ROS: Dementia     Physical Exam Updated Vital Signs BP (!) 143/86   Pulse 61   Temp 97.6 F (36.4 C) (Oral)   Resp (!) 26   SpO2 100%   Physical Exam  Constitutional: She appears well-developed and well-nourished. No distress.  HENT:  Head: Normocephalic and atraumatic.  Right Ear: External ear normal.  Left Ear: External ear normal.  Nose: Nose normal.  Eyes: Right eye exhibits no discharge. Left eye exhibits no discharge.  Cardiovascular: Normal rate, regular rhythm and normal heart sounds.  Pulmonary/Chest: Effort normal and breath sounds normal.  Abdominal: Soft. There is no tenderness.  Neurological: She is alert. She is disoriented.  Skin: Skin is warm and dry. She is not diaphoretic.  Psychiatric: Her mood appears not anxious.  Nursing note and vitals reviewed.    ED Treatments / Results  Labs (all labs ordered are listed, but only abnormal results are  displayed) Labs Reviewed  COMPREHENSIVE METABOLIC PANEL - Abnormal; Notable for the following components:      Result Value   Sodium 131 (*)    Chloride 97 (*)    Glucose, Bld 101 (*)    All other components within normal limits  CBC WITH DIFFERENTIAL/PLATELET - Abnormal; Notable for the following components:   Platelets 439 (*)    All other components within normal limits  URINALYSIS, ROUTINE W REFLEX MICROSCOPIC - Abnormal;  Notable for the following components:   Ketones, ur 5 (*)    All other components within normal limits  I-STAT CG4 LACTIC ACID, ED    EKG EKG Interpretation  Date/Time:  Wednesday December 11 2017 11:30:59 EDT Ventricular Rate:  58 PR Interval:  216 QRS Duration: 74 QT Interval:  430 QTC Calculation: 422 R Axis:   13 Text Interpretation:  Sinus bradycardia with 1st degree A-V block Nonspecific T wave abnormality Abnormal ECG T wave changes similar to Dec 04 2017 Artifact Confirmed by Sherwood Gambler [phone]) on 12/11/2017 12:08:30 PM   Radiology No results found.  Procedures Procedures (including critical care time)  Medications Ordered in ED Medications - No data to display   Initial Impression / Assessment and Plan / ED Course  I have reviewed the triage vital signs and the nursing notes.  Pertinent labs & imaging results that were available during my care of the patient were reviewed by me and considered in my medical decision making (see chart for details).     Patient has not been hypotensive here and has in fact had some hypertension.  Unclear why she was hypotensive this morning but was never symptomatic and does not appear to have any endorgan damage.  I doubt sepsis/septic shock.  Currently has a clear urine.  At this point, I think she can follow-up with her PCP for better outpatient blood pressure control as it seems that she is waxing and waning between hypo-and hypertension.  However she appears stable for an outpatient work-up.  Final  Clinical Impressions(s) / ED Diagnoses   Final diagnoses:  Hypotension, unspecified hypotension type    ED Discharge Orders    None       Sherwood Gambler, MD 12/11/17 1952

## 2017-12-11 NOTE — Discharge Instructions (Addendum)
It is unclear why your blood pressure was low this morning.  If it drops below 90 on the top number, or if she develops fever, vomiting, or any other new/concerning symptoms and return here or follow-up with your family physician.

## 2017-12-11 NOTE — Progress Notes (Signed)
Cardiology Office Note   Date:  12/11/2017   ID:  Candice Schultz, Candice Schultz 1934-12-10, MRN [id number]  PCP:  Candice Orn, MD  Cardiologist:  Candice Moores, MD EP: None  Chief Complaint  Patient presents with  . Hypotension      History of Present Illness: Candice Schultz is a 83 y.o. female with a PMH of atrial fibrillation on eliquis, MVP, HLD, breast cancer s/p lumpectomy, and lewy body dementia c/b hallucinations, who has recently been experiencing hypotension, who presents today for routine annual visit and further assistance with hypotension.  She was last seen by Dr. Acie Schultz 10/2016 and was felt to be doing okay from a cardiac standpoint. She reported occasional dizzy spells. She was maintaining NSR at her last visit. No medication changes occurred at this visit and she was recommended to follow-up in 1 year. Her last Echo 10/2015 with EF 55-60%, G2DD, mild MR, and mild-mod pulmHTN (felt to be age related). She had a LHC 2010 to evaluate chest pain which showed normal coronaries.   She presents today and has been having difficulty with hypotension. She is here with her daughter Candice Schultz and caregiver Candice Schultz. They report that for the past 1.5 weeks she has been experiencing frequent SBPs in the 80s-90s. She has been seen by her PCP a couple times since then and it was felt that her trazodone was the offending medications and has been adjusted several times which has created insomnia issues. Prior to this she has not had any medication changes in the past year with the exception of a trial of wellbutrin which was discontinued a couple weeks ago for joint paints. She was recently prescribe sinemet for parkinsonian symptoms but has not started this medication yet. Her PCP checked a UA/UCx yesterday and reported +bacteria and prescribed Cipro for presumed UTI. She is quite limited functionally, unable to perform ADLs, and requires 24/7 supervision. She has limited communication abilities and has  reported occasional dizziness but no complaints of chest pain or SOB and no episodes of syncope.    Past Medical History:  Diagnosis Date  . AF (atrial fibrillation) ([place])   . Anxiety   . Cancer (HCC)    breast  . Chest pain   . Constipation   . Dementia ([place])    mild  . Family history of adverse reaction to anesthesia    " my daughter"  . Heart palpitations    related for flecainide and due to having afib   . Hyperlipidemia   . Hypertension   . Lewy body dementia [facility])     Past Surgical History:  Procedure Laterality Date  . ABDOMINAL HYSTERECTOMY    . APPENDECTOMY    . BREAST BIOPSY    . CARDIAC CATHETERIZATION  08/2008   NORMAL CORONARY ARTERIES     Current Outpatient Medications  Medication Sig Dispense Refill  . acetaminophen (TYLENOL) 500 MG tablet Take 500 mg by mouth every 6 (six) hours as needed for headache.    . Calcium Citrate-Vitamin D (CALCIUM CITRATE + D PO) Take 1 tablet by mouth 2 (two) times daily.    . carbidopa-levodopa (SINEMET IR) 25-100 MG tablet Take 1/2 tablet three times a day with meals, then after a week increase to 1 tablet three times a day with meals 90 tablet 11  . ciprofloxacin (CIPRO) 250 MG tablet Take 1 tablet by mouth 2 (two) times daily.  0  . docusate sodium (COLACE) 100 MG capsule Take 100 mg by  mouth 2 (two) times daily.    Marland Kitchen ELIQUIS 2.5 MG TABS tablet TAKE 1 TABLET BY MOUTH TWICE DAILY 60 tablet 6  . flecainide (TAMBOCOR) 100 MG tablet TAKE 1 TABLET BY MOUTH TWICE DAILY 180 tablet 0  . hydroxypropyl methylcellulose / hypromellose (ISOPTO TEARS / GONIOVISC) 2.5 % ophthalmic solution Place 1 drop into both eyes 3 (three) times daily as needed for dry eyes.    . metoprolol succinate (TOPROL-XL) 25 MG 24 hr tablet TAKE 1 TABLET(25 MG) BY MOUTH DAILY 90 tablet 2  . Multiple Vitamin (MULTIVITAMIN) tablet Take 1 tablet by mouth daily.     Marland Kitchen olopatadine (PATANOL) 0.1 % ophthalmic solution     . Pimavanserin Tartrate (NUPLAZID) 34 MG CAPS  Take 1 capsule by mouth daily. 90 capsule 3  . risperiDONE (RISPERDAL) 0.25 MG tablet Take 0.25 mg by mouth daily as needed (anxiety).    . traZODone (DESYREL) 50 MG tablet Take 75 mg by mouth at bedtime.     No current facility-administered medications for this visit.     Allergies:   Meperidine; Midazolam; Pravastatin; Propafenone hcl er; Sulfa antibiotics; and Penicillins    Social History:  The patient  reports that she quit smoking about 34 years ago. She has never used smokeless tobacco. She reports that she does not drink alcohol or use drugs.   Family History:  The patient's family history includes Cancer in her mother and sister; Cancer - Other in her brother.    ROS:  Please see the history of present illness.   Otherwise, review of systems are positive for none.   All other systems are reviewed and negative.    PHYSICAL EXAM: VS:  BP (!) 76/36   Pulse (!) 55   Ht 5\' 6"  (1.676 m)   Wt 125 lb (56.7 kg)   SpO2 97%   BMI 20.18 kg/m  , BMI Body mass index is 20.18 kg/m. GEN: Well nourished, well developed, sitting in wheelchair in no acute distress HEENT: sclera anicteric Neck: no JVD, carotid bruits, or masses Cardiac: RRR; no murmurs, rubs, or gallops, no edema  Respiratory:  clear to auscultation bilaterally, normal work of breathing GI: soft, nontender, nondistended, + BS MS: no deformity or atrophy Skin: warm and dry, no rash Neuro:  A&O x1 Psych: pleasantly demented   EKG:  EKG is ordered today. The ekg ordered today demonstrates sinus bradycardia with 1st degree AV block, rate 55, and non-specific T wave abnormalities; no significant change from previous.    Recent Labs: 12/04/2017: BUN 23; Creatinine, Ser 1.08; Hemoglobin 11.7; Platelets 441; Potassium 4.2; Sodium 131    Lipid Panel    Component Value Date/Time   CHOL  09/09/2008 0412    140        ATP III CLASSIFICATION:  <200     mg/dL   Desirable  200-239  mg/dL   Borderline High  >=240    mg/dL    High          TRIG 52 09/09/2008 0412   HDL 58 09/09/2008 0412   CHOLHDL 2.4 09/09/2008 0412   VLDL 10 09/09/2008 0412   LDLCALC  09/09/2008 0412    72        Total Cholesterol/HDL:CHD Risk Coronary Heart Disease Risk Table                     Men   Women  1/2 Average Risk   3.4   3.3  Average Risk  5.0   4.4  2 X Average Risk   9.6   7.1  3 X Average Risk  23.4   11.0        Use the calculated Patient Ratio above and the CHD Risk Table to determine the patient's CHD Risk.        ATP III CLASSIFICATION (LDL):  <100     mg/dL   Optimal  100-129  mg/dL   Near or Above                    Optimal  130-159  mg/dL   Borderline  160-189  mg/dL   High  >190     mg/dL   Very High      Wt Readings from Last 3 Encounters:  12/11/17 125 lb (56.7 kg)  12/04/17 123 lb 7.3 oz (56 kg)  11/13/17 125 lb (56.7 kg)      Other studies Reviewed: Additional studies/ records that were reviewed today include:   Echocardiogram 2017: Study Conclusions  - Left ventricle: The cavity size was normal. Wall thickness was   normal. Systolic function was normal. The estimated ejection   fraction was in the range of 55% to 60%. Wall motion was normal;   there were no regional wall motion abnormalities. GLS -21.9%.   Features are consistent with a pseudonormal left ventricular   filling pattern, with concomitant abnormal relaxation and   increased filling pressure (grade 2 diastolic dysfunction). - Aortic valve: There was no stenosis. - Mitral valve: Mildly calcified annulus. There was mild   regurgitation. - Left atrium: The atrium was mildly dilated. - Right ventricle: The cavity size was normal. Systolic function   was normal. - Tricuspid valve: Peak RV-RA gradient (S): 41 mm Hg. - Pulmonary arteries: PA peak pressure: 49 mm Hg (S). - Systemic veins: IVC measured 1.8 cm with < 50% respirophasic   variation, suggesting RA pressure 8 mmHg.  Impressions:  - Normal LV size and  systolic function, EF 60-63%. Normal RV size   and systolic function. Mild mitral regurgitation. Mild to   moderate pulmonary hypertension.  ASSESSMENT AND PLAN:  1. Hypotension: BP 76/36 today. Unclear what caused the sudden change in blood pressure 2 weeks ago. Differential includes infection/sepsis, medications (less likely as there haven't been any significant changes in the past year), autonomic dysfunction. She was started on cipro last night for presumed UTI. Does not appear to be symptomatic, however patient is unable contribute to history.  - Will send to the ED for further evaluation of hypotension. - Recommended compression stockings for supportive care   2. Atrial fibrillation: maintaining sinus rhythm. On apixaban 2.5mg  BID for stroke ppx - no recent falls or problems with bleeding reported. On digoxin and flecainide for rhythm control. On metoprolol for rate control. Digoxin not an ideal medication for the elderly, particularly in her case where she is experiencing hallucinations.  - Would check digoxin level on arrival to the ED - Will discontinue digoxin at this time.  - Anticipate holding metoprolol in the setting of hypotension, although hopeful to resume once hypotension resolved.  - Continue flecainide for now  3. HLD: followed by PCP. No longer on statin therapy  4. Lewy body dementia: Follows outpatient with Dr. Delice Lesch (Neurology). She requires 24hour supervision at this point. She is on risperdalprn, and trazodone which can all contribute to hypotension. Also on Nuplazid for hallucinations. Her trazodone was recently decreased resulting in significant insomnia - unclear if this  is contributing to #1 given longstanding use without complications.  - Could consider alternative sleep aid - will defer to Neurology/PCP - Could consider referral to palliative care for assistance with symptom management as the patients dementia progresses.    Plan discussed with Dr. Caryl Comes in the  office who agreed with stopping digoxin and referring patient to the ED for further evaluation.     Current medicines are reviewed at length with the patient today.  The patient does not have concerns regarding medicines.  The following changes have been made:  Digoxin discontinued  Labs/ tests ordered today include: Patient referred to [facility] ED for further evaluation of hypotension   Orders Placed This Encounter  Procedures  . EKG 12-Lead     Disposition:   FU with Dr. Acie Schultz already scheduled for 12/30/17 at [place], Abigail Butts, PA-C  12/11/2017 12:00 PM

## 2017-12-12 ENCOUNTER — Telehealth: Payer: Self-pay | Admitting: Neurology

## 2017-12-12 NOTE — Telephone Encounter (Signed)
Patient's daughter called regarding her mother and her not sleeping. She said she may have had 30 Min to 45 min of sleep a night in the past 6 nights. She said Dr. Delice Lesch had adjusted her medication but she would like to speak with you regarding what to do? Please Call. Thanks

## 2017-12-12 NOTE — Telephone Encounter (Signed)
It's only been one night, I think we can try increasing to 2 tabs but would be hesitant to increase more than that. Thanks

## 2017-12-12 NOTE — Telephone Encounter (Signed)
Spoke with pt's daughter, [name].  She states that pt's BP has improved since d/c of Trazodone however pt is still not sleeping.  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  [name] is wondering if she can increase Doxepin?  This was just added to pt's medications yesterday.  pls advise.

## 2017-12-12 NOTE — Telephone Encounter (Signed)
Spoke with [employer] below.  I asked that [name] try 2 tabs Doxepin each night over the weekend and call office on Monday with an update. Beverly expressed understanding.

## 2017-12-17 ENCOUNTER — Telehealth: Payer: Self-pay | Admitting: Neurology

## 2017-12-17 NOTE — Telephone Encounter (Signed)
Patient's daughter called in stating she wanted to f/u on medication change. A little bit better but not a lot. She didn't know if there was anything else that could help. Please call back at [phone]. Thanks!

## 2017-12-19 NOTE — Telephone Encounter (Signed)
Patient's daughter is calling back again regarding prev msg. Please call her back. Thanks!

## 2017-12-20 NOTE — Telephone Encounter (Signed)
Spoke with pt's daughter, [name], who states that after pt started Doxepin there were a few good nights where pt slept 7 hour or so.  But since then, pt has not slept more than 30 minutes to an hour.  Is up all night hallucinating (states that she was molested by a strange man in the kitchen), pt is speaking in her own made up language and gets upset when family does not understand her.  [name] states that pt is on the top of the wait list for a memory car bed at [employer].  [name] goes on to state that pt did have a few hypotension episodes since stopping Trazodone.  I advised Beverly to d/c Doxepin and re-start Trazodone 50mg  - 1.5 Tab QHS over the weekend and call office on Monday with an update.

## 2017-12-22 ENCOUNTER — Emergency Department (HOSPITAL_COMMUNITY): Payer: Medicare Other

## 2017-12-22 ENCOUNTER — Encounter (HOSPITAL_COMMUNITY): Payer: Self-pay

## 2017-12-22 ENCOUNTER — Emergency Department (HOSPITAL_COMMUNITY)
Admission: EM | Admit: 2017-12-22 | Discharge: 2017-12-22 | Disposition: A | Discharge status: Home / Self Care | Payer: Medicare Other | Attending: Emergency Medicine | Admitting: Emergency Medicine

## 2017-12-22 ENCOUNTER — Other Ambulatory Visit: Payer: Self-pay

## 2017-12-22 DIAGNOSIS — R5383 Other fatigue: Secondary | ICD-10-CM | POA: Insufficient documentation

## 2017-12-22 DIAGNOSIS — I10 Essential (primary) hypertension: Secondary | ICD-10-CM | POA: Insufficient documentation

## 2017-12-22 DIAGNOSIS — G3183 Dementia with Lewy bodies: Secondary | ICD-10-CM | POA: Diagnosis not present

## 2017-12-22 DIAGNOSIS — Z7901 Long term (current) use of anticoagulants: Secondary | ICD-10-CM | POA: Insufficient documentation

## 2017-12-22 DIAGNOSIS — Z79899 Other long term (current) drug therapy: Secondary | ICD-10-CM | POA: Insufficient documentation

## 2017-12-22 DIAGNOSIS — Z87891 Personal history of nicotine dependence: Secondary | ICD-10-CM | POA: Diagnosis not present

## 2017-12-22 DIAGNOSIS — F0281 Dementia in other diseases classified elsewhere with behavioral disturbance: Secondary | ICD-10-CM | POA: Diagnosis not present

## 2017-12-22 LAB — CBC
HEMATOCRIT: 36.9 % (ref 36.0–46.0)
Hemoglobin: 12 g/dL (ref 12.0–15.0)
MCH: 30.9 pg (ref 26.0–34.0)
MCHC: 32.5 g/dL (ref 30.0–36.0)
MCV: 95.1 fL (ref 80.0–100.0)
NRBC: 0 % (ref 0.0–0.2)
PLATELETS: 446 10*3/uL — AB (ref 150–400)
RBC: 3.88 MIL/uL (ref 3.87–5.11)
RDW: 11.7 % (ref 11.5–15.5)
WBC: 5.8 10*3/uL (ref 4.0–10.5)

## 2017-12-22 LAB — COMPREHENSIVE METABOLIC PANEL
ALT: 31 U/L (ref 0–44)
ANION GAP: 4 — AB (ref 5–15)
AST: 27 U/L (ref 15–41)
Albumin: 3.5 g/dL (ref 3.5–5.0)
Alkaline Phosphatase: 92 U/L (ref 38–126)
BUN: 31 mg/dL — ABNORMAL HIGH (ref 8–23)
CALCIUM: 9.4 mg/dL (ref 8.9–10.3)
CHLORIDE: 99 mmol/L (ref 98–111)
CO2: 29 mmol/L (ref 22–32)
CREATININE: 1.06 mg/dL — AB (ref 0.44–1.00)
GFR calc Af Amer: 56 mL/min — ABNORMAL LOW (ref >60)
GFR, EST NON AFRICAN AMERICAN: 48 mL/min — AB (ref >60)
Glucose, Bld: 113 mg/dL — ABNORMAL HIGH (ref 70–99)
Potassium: 4.4 mmol/L (ref 3.5–5.1)
SODIUM: 132 mmol/L — AB (ref 135–145)
Total Bilirubin: 0.7 mg/dL (ref 0.3–1.2)
Total Protein: 6.7 g/dL (ref 6.5–8.1)

## 2017-12-22 LAB — URINALYSIS, ROUTINE W REFLEX MICROSCOPIC
Bilirubin Urine: NEGATIVE
GLUCOSE, UA: NEGATIVE mg/dL
HGB URINE DIPSTICK: NEGATIVE
KETONES UR: NEGATIVE mg/dL
Leukocytes, UA: NEGATIVE
NITRITE: NEGATIVE
Protein, ur: NEGATIVE mg/dL
Specific Gravity, Urine: 1.015 (ref 1.005–1.030)
pH: 5 (ref 5.0–8.0)

## 2017-12-22 LAB — PROTIME-INR
INR: 1.05
Prothrombin Time: 13.7 seconds (ref 11.4–15.2)

## 2017-12-22 MED ORDER — SODIUM CHLORIDE 0.9 % IV BOLUS
500.0000 mL | Freq: Once | INTRAVENOUS | Status: AC
  Administered 2017-12-22: 500 mL via INTRAVENOUS

## 2017-12-22 NOTE — ED Provider Notes (Signed)
EKG:  Rhythm: normal sinus Rate: 65 QRS: 93 ms QTc: 447 ms ST segments: NS ST changes    Candice Manifold, MD 12/26/17 1535

## 2017-12-22 NOTE — ED Provider Notes (Signed)
[facility] DEPT Provider Note   CSN: [id number] Arrival date & time: 12/22/17  1322     History   Chief Complaint Chief Complaint  Patient presents with  . Dementia  . Failure To Thrive  . Hypotension    HPI Candice Schultz is a 83 y.o. female.  HPI   Candice Schultz is an 83yo female with a history of lewy body dementia, atrial fibrillation (on eliquis), HTN, HLD who presents to the Emergency Department with her daughter for evaluation of increased sleepiness and fatigue. Level 5 caveat applies given patient's dementia. Patient's daughter who is her POA and caretaker states that her dementia has been rapidly progressing through stages. For about a month she was not sleeping more than 34min a night. This was after she was switched from 150mg  trazodone to doxepin. Given her lack of sleep, neurologist started her on trazodone 100mg  at nighttime two days ago. Daughter states that patient has been sleeping through the night, but also throughout the entire day. She doesn't want to wake up to eat or drink. Of note, she had a urinary tract infection earlier in the month which was treated with ciprofloxacin and she is concerned that UTI may have returned. She went to an urgent care today to get the urine checked and her blood pressure was 70s/40s and they sent her here. According to daughter, she has not been complaining of pain. Has drank a glass of water and prune juice. No fever, vomiting, cough, rash, malodorous urine.   Past Medical History:  Diagnosis Date  . AF (atrial fibrillation) ([place])   . Anxiety   . Cancer (HCC)    breast  . Chest pain   . Constipation   . Dementia ([place])    mild  . Family history of adverse reaction to anesthesia    " my daughter"  . Heart palpitations    related for flecainide and due to having afib   . Hyperlipidemia   . Hypertension   . Lewy body dementia [facility])     Patient Active Problem List   Diagnosis Date Noted  .  Hyponatremia 09/03/2016  . Sinus bradycardia on ECG 09/03/2016  . Bradycardia   . Essential hypertension   . Lewy body dementia with behavioral disturbance ([place]) 08/15/2016  . Mitral valve prolapse 10/25/2015  . Mild dementia ([place]) 02/25/2015  . Encounter for therapeutic drug monitoring 04/24/2013  . History of breast cancer 12/11/2010  . Chest pain   . Hyperlipidemia   . Atrial fibrillation ([place]) 06/06/2010  . Long term current use of anticoagulant 06/06/2010    Past Surgical History:  Procedure Laterality Date  . ABDOMINAL HYSTERECTOMY    . APPENDECTOMY    . BREAST BIOPSY    . CARDIAC CATHETERIZATION  08/2008   NORMAL CORONARY ARTERIES     OB History   None      Home Medications    Prior to Admission medications   Medication Sig Start Date End Date Taking? Authorizing Provider  acetaminophen (TYLENOL) 500 MG tablet Take 500 mg by mouth every 6 (six) hours as needed for headache.   Yes [provider]  Calcium Citrate-Vitamin D (CALCIUM CITRATE + D PO) Take 1 tablet by mouth daily.    Yes [provider]  docusate sodium (COLACE) 100 MG capsule Take 200 mg by mouth 2 (two) times daily.    Yes [provider]  ELIQUIS 2.5 MG TABS tablet TAKE 1 TABLET BY MOUTH TWICE DAILY  05/23/17  Yes Nahser, Wonda Cheng, MD  fexofenadine (ALLEGRA) 180 MG tablet Take 180 mg by mouth daily.   Yes [provider]  flecainide (TAMBOCOR) 100 MG tablet TAKE 1 TABLET BY MOUTH TWICE DAILY 10/09/17  Yes Nahser, Wonda Cheng, MD  hydroxypropyl methylcellulose / hypromellose (ISOPTO TEARS / GONIOVISC) 2.5 % ophthalmic solution Place 1 drop into both eyes 3 (three) times daily as needed for dry eyes.   Yes [provider]  metoprolol succinate (TOPROL-XL) 25 MG 24 hr tablet TAKE 1 TABLET(25 MG) BY MOUTH DAILY 04/01/17  Yes Nahser, Wonda Cheng, MD  Multiple Vitamin (MULTIVITAMIN) tablet Take 1 tablet by mouth daily.    Yes [provider]  olopatadine (PATANOL)  0.1 % ophthalmic solution Place 1 drop into both eyes 2 (two) times daily as needed for allergies (dry eyes).  07/08/17  Yes [provider]  Pimavanserin Tartrate (NUPLAZID) 34 MG CAPS Take 1 capsule by mouth daily. 11/13/17  Yes Cameron Sprang, MD  polyethylene glycol [facility] / Floria Raveling) packet Take 17 g by mouth daily as needed for mild constipation.   Yes [provider]  Probiotic Product (ALIGN PO) Take 1 capsule by mouth daily.   Yes [provider]  risperiDONE (RISPERDAL) 0.25 MG tablet Take 0.25 mg by mouth daily as needed (anxiety).   Yes [provider]  traZODone (DESYREL) 50 MG tablet Take 100 mg by mouth at bedtime.    Yes [provider]  carbidopa-levodopa (SINEMET IR) 25-100 MG tablet Take 1/2 tablet three times a day with meals, then after a week increase to 1 tablet three times a day with meals 11/13/17   Cameron Sprang, MD  ciprofloxacin (CIPRO) 250 MG tablet Take 1 tablet by mouth 2 (two) times daily. 12/09/17   [provider]  Doxepin HCl 3 MG TABS Take 1 tablet (3 mg total) by mouth at bedtime. Patient not taking: Reported on 12/22/2017 12/11/17   Cameron Sprang, MD    Family History Family History  Problem Relation Age of Onset  . Cancer Mother        Brain  . Cancer Sister        Breast  . Cancer - Other Brother     Social History Social History   Tobacco Use  . Smoking status: Former Smoker    Last attempt to quit: 02/27/1983    Years since quitting: 34.8  . Smokeless tobacco: Never Used  Substance Use Topics  . Alcohol use: No    Alcohol/week: 0.0 standard drinks  . Drug use: No     Allergies   Meperidine; Midazolam; Pravastatin; Propafenone hcl er; Sulfa antibiotics; and Penicillins   Review of Systems Review of Systems  Unable to perform ROS: Dementia     Physical Exam Updated Vital Signs BP (!) 176/74   Pulse (!) 58   Temp 98.3 F (36.8 C)   Resp 13   Ht 5\' 6"  (1.676 m)   Wt  56.7 kg   SpO2 96%   BMI 20.18 kg/m   Physical Exam  Constitutional: She appears well-developed and well-nourished. No distress.  Drowsy, but wakes up easily. No acute distress.   HENT:  Head: Normocephalic and atraumatic.  Mouth/Throat: Oropharynx is clear and moist.  Mucous membranes moist.   Eyes: Pupils are equal, round, and reactive to light. Conjunctivae are normal. Right eye exhibits no discharge. Left eye exhibits no discharge.  Neck: Normal range of motion. Neck supple.  Cardiovascular: Normal  rate, regular rhythm and intact distal pulses.  Pulmonary/Chest: Effort normal and breath sounds normal. No stridor. No respiratory distress. She has no wheezes. She has no rales.  Abdominal: Soft. Bowel sounds are normal. There is no tenderness.  Neurological: She is alert. Coordination normal.  Mental Status:  Speech fluent without evidence of aphasia. Able to follow commands without difficulty.  Cranial Nerves:  II:  Peripheral visual fields grossly normal, pupils equal, round, reactive to light III,IV, VI: ptosis not present, extra-ocular motions intact bilaterally  V,VII: smile symmetric, facial light touch sensation equal VIII: hearing grossly normal to voice  X: uvula elevates symmetrically  XI: bilateral shoulder shrug symmetric and strong XII: midline tongue extension without fassiculations Motor:  Normal tone. 5/5 in upper and lower extremities bilaterally including strong and equal grip strength and dorsiflexion/plantar flexion Sensory: Sensation to light touch normal in all extremities.  CV: distal pulses palpable throughout   Skin: Skin is warm and dry. She is not diaphoretic.  Psychiatric: She has a normal mood and affect. Her behavior is normal.  Nursing note and vitals reviewed.   ED Treatments / Results  Labs (all labs ordered are listed, but only abnormal results are displayed) Labs Reviewed  COMPREHENSIVE METABOLIC PANEL - Abnormal; Notable for the following  components:      Result Value   Sodium 132 (*)    Glucose, Bld 113 (*)    BUN 31 (*)    Creatinine, Ser 1.06 (*)    GFR calc non Af Amer 48 (*)    GFR calc Af Amer 56 (*)    Anion gap 4 (*)    All other components within normal limits  CBC - Abnormal; Notable for the following components:   Platelets 446 (*)    All other components within normal limits  PROTIME-INR  URINALYSIS, ROUTINE W REFLEX MICROSCOPIC  CBG MONITORING, ED    EKG EKG Interpretation  Date/Time:  Sunday December 22 2017 15:08:48 EDT Ventricular Rate:  58 PR Interval:    QRS Duration: 94 QT Interval:  439 QTC Calculation: 432 R Axis:   2 Text Interpretation:  Sinus rhythm Prolonged PR interval Confirmed by Lacretia Leigh ([id number]) on 12/23/2017 7:30:21 PM   Radiology Dg Chest 2 View  Result Date: 12/22/2017 CLINICAL DATA:  Lethargy, UTI EXAM: CHEST - 2 VIEW COMPARISON:  11/02/2016 FINDINGS: Lungs are essentially clear. Mild eventration left hemidiaphragm with associated left basilar atelectasis. No focal consolidation. No pleural effusion or pneumothorax. The heart is normal in size. Moderate superior endplate compression fracture deformity of a lower thoracic vertebral body, new from 2018. IMPRESSION: No evidence of acute cardiopulmonary disease. Moderate superior endplate compression fracture deformity of a lower thoracic vertebral body, new from 2018. Electronically Signed   By: Julian Hy M.D.   On: 12/22/2017 15:59    Procedures Procedures (including critical care time)  Medications Ordered in ED Medications  sodium chloride 0.9 % bolus 500 mL (0 mLs Intravenous Stopped 12/22/17 1643)     Initial Impression / Assessment and Plan / ED Course  I have reviewed the triage vital signs and the nursing notes.  Pertinent labs & imaging results that were available during my care of the patient were reviewed by me and considered in my medical decision making (see chart for details).     Patient  with a history of LB Dementia presents with her daughter for evaluation of hypotension at urgent care today as well as increased fatigue and concern for UTI. Other  than being tired, she reports patient is at her mental baseline. On exam, patient sleeping but easily arousable. No signs of dehydration. No focal neurological deficits. She is actually hypertensive here up to 176/74. She is afebrile, normal rectal temp.    Lab work unremarkable. No UTI. CBC and CMP unremarkable. CXR without acute abnormality. On recheck vital signs continue to be stable. She is up in bed eating a sandwich and drinking coke. I suspect that her tiredness is related to recently starting trazodone. I have counseled patient's daughter at bedside to hold this medication tonight and follow up with her neurologist as to whether this medication should be continued. We discussed return precautions and daughter who is patient's caretaker agrees. This was a shared visit with Dr. Gilford Raid who also saw the patient.    Final Clinical Impressions(s) / ED Diagnoses   Final diagnoses:  Fatigue, unspecified type  Lewy body dementia with behavioral disturbance [facility])    ED Discharge Orders    None       Bernarda Caffey 12/24/17 Benbow, Julie, MD 12/24/17 [phone]

## 2017-12-22 NOTE — Discharge Instructions (Addendum)
Blood work, chest xray, urine and ekg reassuring.   Please do not give her trazodone tonight and follow up with neurologist regarding further medication management.   Her blood pressure was high today please discuss this at her cardiology appointment.   Come back to the ER for any new or worsening symptoms like vomiting, fever, abdominal pain.

## 2017-12-22 NOTE — ED Triage Notes (Signed)
Patient comes from home with family at bedside. Patient is AOx1-2 at baseline due to dementia. Patient was recently treated for UTI and has finished Cipro on October 19. Patient since this past Friday has been very lethargic, sleeping most of the day, not eating or drinking as much either. Patient is on blood thinners for A-Fib. Patient did have medication adjusted by Neurologist and Primary Care Provider as well recently.

## 2017-12-23 ENCOUNTER — Telehealth: Payer: Self-pay | Admitting: Cardiovascular Disease

## 2017-12-23 NOTE — Telephone Encounter (Signed)
New message    Pt c/o BP issue: STAT if pt c/o blurred vision, one-sided weakness or slurred speech  1. What are your last 5 BP readings? 80/50 77/47 150/105 (when she was in the shower)  2. Are you having any other symptoms (ex. Dizziness, headache, blurred vision, passed out)? Severe headaches    3. What is your BP issue?  bp has dropped extremely low and the shoots real high - was in ER yesterday and they gave her fluids -  She was in the office a week ago and they took away the Digoxin , her states that her bp goes from High to real low and was sent from doctor office to ER for it being low   160/92 last night when they released her from hospital

## 2017-12-23 NOTE — Telephone Encounter (Signed)
Received call back from patient's daughter, Candice Schultz, who states she has not listened to the voice mail that I left. I reviewed Dr. Elmarie Shiley advice with her and she states she is very frustrated and confused about how to treat her mom's fluctuating BP. States patient has been advised to seek treatment in ED x 3 recently for hypotension, all of which were after being seen by medical provider, on 10/16 was after being seen in our office. She states patient was again in ED yesterday and was given IV fluids for hypotension (this is the only visit in which this occurred). BP was high when patient was discharged yesterday.  States today BP has been low until caregiver got patient up to shower. States patient was c/o head pain and crying and BP was checked and was elevated at 150/105 mmHg. States they are concerned for stroke with high BP due to family hx. States patient cannot bear weight and is no longer mobile so falling is not a concern. Daughter and caregiver are giving patient salty foods when BP is low. We discussed that there are likely to be frequent variations in BP and that symptom management may be the better mechanism for monitoring the patient. They are awaiting a bed in the memory care unit at [facility] per daughter. She states she is not comfortable with stopping or adjusting Toprol or Flecainide because those medications, in addition to digoxin which was stopped last week, have kept patient out of afib for many years. I advised that Dr. Acie Fredrickson is in clinic today but that I will ask him to review patient's chart for additional advice and I will call her back later. Candice Schultz verbalized understanding and agreement and thanked me for my help.

## 2017-12-23 NOTE — Telephone Encounter (Signed)
Left detailed message on daughter's voice mail that hypotension is a complication associated with Parkinson's and the Sinemet that the patient has been prescribed. I advised that Dr. Acie Fredrickson would prefer patient's BP be a little too high than too low due to low BP putting her at a risk for fall. I advised her to give patient a salty meal, like chicken noodle soup if BP remains low. I advised that I will try to call back later today or that she may call be back at her convenience.

## 2017-12-24 NOTE — Telephone Encounter (Signed)
I talked with daughter, Candice Schultz has declined significantly over the past several months She has Lewy Body dementia which is associated with profound dysautonomia. Candice Schultz has had wide flucuations in her BP. I've explained that this is an expected complication with her condition. She has not yet started the Carbidoa-Leva dopa.  We will not make any changes at this point She has an appt to see me on Monday . Will assess her and discuss with family at that time    [name], MD  12/24/2017 3:13 PM    [address],  [place], [place]  [address] Pager [phone] Phone: [phone]; Fax: [phone]

## 2017-12-30 ENCOUNTER — Encounter: Payer: Self-pay | Admitting: Cardiovascular Disease

## 2017-12-30 ENCOUNTER — Ambulatory Visit: Payer: Medicare Other | Admitting: Cardiovascular Disease

## 2017-12-30 VITALS — BP 82/48 | HR 61 | Ht 66.0 in | Wt 125.8 lb

## 2017-12-30 DIAGNOSIS — I480 Paroxysmal atrial fibrillation: Secondary | ICD-10-CM

## 2017-12-30 MED ORDER — APIXABAN 2.5 MG PO TABS: 2.5000 mg | ORAL_TABLET | Freq: Two times a day (BID) | ORAL | 11 refills | Status: DC

## 2017-12-30 MED ORDER — METOPROLOL SUCCINATE ER 25 MG PO TB24: 25.0000 mg | ORAL_TABLET | Freq: Every day | ORAL | 3 refills | Status: AC

## 2017-12-30 NOTE — Progress Notes (Signed)
Candice Schultz Date of Birth  1934-04-05 [facility] / [facility] [address]     [place], [place]  [address] [phone]  Fax  [phone]  Problem List: 1. Atrial Fibrillation 2. Chest pain - normal coronary arteries 2010 3.  hyperlipidemia 4. Hx of Breast cancer - 1999  History of Present Illness:  Candice Schultz has done well.  Her rhythm has remained stable. She continues to have constipation due to the Flecainide.  She takes propranolol occasionally .  She is exercising on occasion.  09/08/2012  Candice Schultz is doing well.  She has been having some memory loss and she has been holding the pravachol.  Her last LDL at Dr., Jenny Reichmann Delene Ruffini office was 122.  Total chol was 227. Trigs = 151  She remains active.  She has occasional palpitations and takes PRN propranolol.    September 10, 2013:  Candice Schultz is doing OK.  She has white coat HTN .  BP at  Home is always ok.  BP of 110 typically.   Staying in NSR. No CP or dyspnea.  She has bee losing  weight without any explanation   She had an episode CP while in [place] on Oct.   Was taken to [facility] - cardiac cath looked great.    September 20, 2014:  Has had a good year. No CP or dyspnea  Able to do all of her normal activities without problems .  Is concerned about some weight loss Will be talking to Dr. Laurann Montana about it .  Aug. 28, 2017  Doing well from a cardiac stanpoint  No CP or dyspnea.  Not getting much exercise.  Getting out to [facility].  Feb. 13, 2018:  Doing OK Eating ok Her children are concerned about her diet and her weight loss  No CP or dyspnea  Sept. 27, 2018:   Seen with daughter, [name] Follow up for her MVP and atrial fib  Is on Eliquis 2.5 BID  Was recently diagnosed with Lewy Body  Dementia and she is on Nuplazid She seems to be improving with this  No real issues with CP  Has occasional dizzy spells   December 30, 2017: Candice Schultz is seen today with her daughter,  [name].  She has had progressive dementia from her Lewy body dementia.  She also is having significant hypotension and orthostatic hypotension.  Is now wheelchair bound  Transfers require full assistance  BP is low Still has not started the Carba dopa - leva dopa      Current Outpatient Medications on File Prior to Visit  Medication Sig Dispense Refill  . acetaminophen (TYLENOL) 500 MG tablet Take 500 mg by mouth every 6 (six) hours as needed for headache.    . Calcium Citrate-Vitamin D (CALCIUM CITRATE + D PO) Take 1 tablet by mouth daily.     . carbidopa-levodopa (SINEMET IR) 25-100 MG tablet Take 1/2 tablet three times a day with meals, then after a week increase to 1 tablet three times a day with meals 90 tablet 11  . docusate sodium (COLACE) 100 MG capsule Take 200 mg by mouth 2 (two) times daily.     Marland Kitchen ELIQUIS 2.5 MG TABS tablet TAKE 1 TABLET BY MOUTH TWICE DAILY 60 tablet 6  . fexofenadine (ALLEGRA) 180 MG tablet Take 180 mg by mouth daily.    . flecainide (TAMBOCOR) 100 MG tablet TAKE 1 TABLET BY MOUTH TWICE DAILY 180 tablet 0  . hydroxypropyl  methylcellulose / hypromellose (ISOPTO TEARS / GONIOVISC) 2.5 % ophthalmic solution Place 1 drop into both eyes 3 (three) times daily as needed for dry eyes.    . metoprolol succinate (TOPROL-XL) 25 MG 24 hr tablet TAKE 1 TABLET(25 MG) BY MOUTH DAILY 90 tablet 2  . Multiple Vitamin (MULTIVITAMIN) tablet Take 1 tablet by mouth daily.     Marland Kitchen olopatadine (PATANOL) 0.1 % ophthalmic solution Place 1 drop into both eyes 2 (two) times daily as needed for allergies (dry eyes).     Marland Kitchen Pimavanserin Tartrate (NUPLAZID) 34 MG CAPS Take 1 capsule by mouth daily. 90 capsule 3  . polyethylene glycol (MIRALAX / GLYCOLAX) packet Take 17 g by mouth daily as needed for mild constipation.    . Probiotic Product (ALIGN PO) Take 1 capsule by mouth daily.    . risperiDONE (RISPERDAL) 0.25 MG tablet Take 0.25 mg by mouth daily as needed (anxiety).    . traZODone  (DESYREL) 50 MG tablet Take 100 mg by mouth at bedtime.      No current facility-administered medications on file prior to visit.     Allergies  Allergen Reactions  . Meperidine     Other reaction(s): nausea  . Midazolam     Other reaction(s): nausea  . Pravastatin Other (See Comments)    Memory Issues  . Propafenone Hcl Er Other (See Comments)    unknown  . Sulfa Antibiotics     Other reaction(s): nausea   . Penicillins Rash    Has patient had a PCN reaction causing immediate rash, facial/tongue/throat swelling, SOB or lightheadedness with hypotension: Yes Has patient had a PCN reaction causing severe rash involving mucus membranes or skin necrosis: Yes Has patient had a PCN reaction that required hospitalization: Unknown Has patient had a PCN reaction occurring within the last 10 years: No If all of the above answers are "NO", then may proceed with Cephalosporin use.     Past Medical History:  Diagnosis Date  . AF (atrial fibrillation) ([place])   . Anxiety   . Cancer (HCC)    breast  . Chest pain   . Constipation   . Dementia ([place])    mild  . Family history of adverse reaction to anesthesia    " my daughter"  . Heart palpitations    related for flecainide and due to having afib   . Hyperlipidemia   . Hypertension   . Lewy body dementia [facility])     Past Surgical History:  Procedure Laterality Date  . ABDOMINAL HYSTERECTOMY    . APPENDECTOMY    . BREAST BIOPSY    . CARDIAC CATHETERIZATION  08/2008   NORMAL CORONARY ARTERIES    Social History   Tobacco Use  Smoking Status Former Smoker  . Last attempt to quit: 02/27/1983  . Years since quitting: 34.8  Smokeless Tobacco Never Used    Social History   Substance and Sexual Activity  Alcohol Use No  . Alcohol/week: 0.0 standard drinks    Family History  Problem Relation Age of Onset  . Cancer Mother        Brain  . Cancer Sister        Breast  . Cancer - Other Brother     Reviw of Systems:  Reviewed  in the HPI.  All other systems are negative.  Physical Exam: Blood pressure (!) 82/48, pulse 61, height 5\' 6"  (1.676 m), weight 125 lb 12.8 oz (57.1 kg), SpO2 96 %.  GEN:  Well  nourished, well developed in no acute distress HEENT: Normal NECK: No JVD; No carotid bruits LYMPHATICS: No lymphadenopathy CARDIAC: RRR, no murmurs, rubs, gallops RESPIRATORY:  Clear to auscultation without rales, wheezing or rhonchi  ABDOMEN: Soft, non-tender, non-distended MUSCULOSKELETAL:  No edema; No deformity  SKIN: Warm and dry NEUROLOGIC:  Alert and oriented x 3   ECG: December 30, 2017: Normal sinus rhythm at 61.  First-degree AV block. Assessment / Plan:   1. Atrial Fibrillation  - has maintained NSR eliquis is now 2.5 BID    2. Chest pain - no CAD by cath   3.  Hyperlipidemia - stable   4. Hx of Breast cancer - 1999  5. MVP - asymptomatic   6. Dementia -  Has been diagnosed with Lewy Body Dementia .  Her clinical course has been progressive.  She now is wheelchair-bound.  She requires 24-hour help.  Having lots of dysautonomia and hypotension because of her Lewy body dementia.  This is a known side effect of that illness.  She is been sent to the emergency room on several occasions and has received IV normal saline.  Discussion with her daughter [name].  At some point I think that we need to refocus her goals.  I do not think that it is useful to to need to send her to the emergency room for IV fluids.  This is a natural of Lewy body dementia.  At some point she will likely need to go into a skilled nursing facility.  [name] is looking for a memory center right now.  We will continue with her current medications.  We will not set up a return appointment but I will be happy to see her if needed   [name], MD  12/30/2017 11:42 AM    [address],  [place], [place]  [address] Pager [phone] Phone: [phone]; Fax: [phone]

## 2017-12-30 NOTE — Patient Instructions (Signed)
Medication Instructions:  Your physician recommends that you continue on your current medications as directed. Please refer to the Current Medication list given to you today.  If you need a refill on your cardiac medications before your next appointment, please call your pharmacy.    Lab work: None Ordered    Testing/Procedures: None Ordered   Follow-Up: At [facility], you and your health needs are our priority.  As part of our continuing mission to provide you with exceptional heart care, we have created designated Provider Care Teams.  These Care Teams include your primary Cardiologist (physician) and Advanced Practice Providers (APPs -  Physician Assistants and Nurse Practitioners) who all work together to provide you with the care you need, when you need it. You will need a follow up appointment in:   as needed.  You may see Philip Nahser, MD or one of the following Advanced Practice Providers on your designated Care Team: Scott Weaver, PA-C Vin Bhagat, PA-C . Janine Hammond, NP   

## 2018-01-02 ENCOUNTER — Other Ambulatory Visit: Payer: Self-pay | Admitting: Cardiovascular Disease

## 2018-01-13 ENCOUNTER — Telehealth: Payer: Self-pay | Admitting: Neurology

## 2018-01-13 NOTE — Telephone Encounter (Signed)
Noted. Forwarding to Dr. Delice Lesch as Juluis Rainier

## 2018-01-13 NOTE — Telephone Encounter (Signed)
Patient has been taken to [facility]osp[facility] house. Her daughter wanted to call and update yall. She left a msg. If you need to call her back it's [phone]. Thanks!

## 2018-01-14 ENCOUNTER — Telehealth: Payer: Self-pay

## 2018-01-14 NOTE — Telephone Encounter (Signed)
-----   Message from Cameron Sprang, MD sent at 01/14/2018  2:52 PM EST ----- Regarding: reassurance Pls just call her daughter and let her know that if there is anything we can do to let us know, and that I do agree Hospice is the best step for her at this point. Thanks

## 2018-01-14 NOTE — Telephone Encounter (Signed)
Spoke with pt's daughter, [name], relaying message below.  [name] states that Hospice is actually coming to pt's home until a bed becomes available at [facility] - pt is next on list for bed.  [name] states that pt's husband is ai long term care resident at [facility] and once pt's bed available, pt will be able to see her husband each day.  I let [name] know that our office is here for her during this time and if there is anything we can do to give [place] a call.  Beverly expressed great appreciation for my call.

## 2018-02-10 ENCOUNTER — Ambulatory Visit: Payer: Medicare Other | Admitting: Neurology

## 2018-03-24 ENCOUNTER — Ambulatory Visit: Payer: Medicare Other | Admitting: Cardiovascular Disease

## 2019-08-26 NOTE — Telephone Encounter (Signed)
Disregard opened in error °

## 2021-11-26 DEATH — deceased
